# Patient Record
Sex: Female | Born: 2000
Health system: Southern US, Community
[De-identification: ages and names within clinical notes are randomized; demographics above are authoritative.]

## PROBLEM LIST (undated history)

## (undated) ENCOUNTER — Inpatient Hospital Stay (HOSPITAL_COMMUNITY): Payer: Self-pay

## (undated) DIAGNOSIS — O24410 Gestational diabetes mellitus in pregnancy, diet controlled: Secondary | ICD-10-CM

## (undated) DIAGNOSIS — O24419 Gestational diabetes mellitus in pregnancy, unspecified control: Secondary | ICD-10-CM

## (undated) DIAGNOSIS — Z8619 Personal history of other infectious and parasitic diseases: Secondary | ICD-10-CM

## (undated) DIAGNOSIS — E119 Type 2 diabetes mellitus without complications: Secondary | ICD-10-CM

## (undated) HISTORY — PX: NO PAST SURGERIES: SHX2092

## (undated) HISTORY — DX: Gestational diabetes mellitus in pregnancy, diet controlled: O24.410

---
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
## 2018-04-07 ENCOUNTER — Emergency Department (HOSPITAL_COMMUNITY)
Admission: EM | Admit: 2018-04-07 | Discharge: 2018-04-07 | Disposition: A | Discharge status: Home / Self Care | Payer: Self-pay | Attending: Emergency Medicine | Admitting: Emergency Medicine

## 2018-04-07 ENCOUNTER — Encounter (HOSPITAL_COMMUNITY): Payer: Self-pay | Admitting: Emergency Medicine

## 2018-04-07 DIAGNOSIS — N898 Other specified noninflammatory disorders of vagina: Secondary | ICD-10-CM | POA: Insufficient documentation

## 2018-04-07 DIAGNOSIS — L739 Follicular disorder, unspecified: Secondary | ICD-10-CM | POA: Insufficient documentation

## 2018-04-07 DIAGNOSIS — B9689 Other specified bacterial agents as the cause of diseases classified elsewhere: Secondary | ICD-10-CM | POA: Insufficient documentation

## 2018-04-07 DIAGNOSIS — N760 Acute vaginitis: Secondary | ICD-10-CM | POA: Insufficient documentation

## 2018-04-07 DIAGNOSIS — F1210 Cannabis abuse, uncomplicated: Secondary | ICD-10-CM | POA: Insufficient documentation

## 2018-04-07 DIAGNOSIS — B360 Pityriasis versicolor: Secondary | ICD-10-CM | POA: Insufficient documentation

## 2018-04-07 LAB — URINALYSIS, ROUTINE W REFLEX MICROSCOPIC
Bilirubin Urine: NEGATIVE
GLUCOSE, UA: NEGATIVE mg/dL
HGB URINE DIPSTICK: NEGATIVE
KETONES UR: NEGATIVE mg/dL
Leukocytes, UA: NEGATIVE
Nitrite: NEGATIVE
PROTEIN: NEGATIVE mg/dL
Specific Gravity, Urine: 1.015 (ref 1.005–1.030)
pH: 7 (ref 5.0–8.0)

## 2018-04-07 LAB — WET PREP, GENITAL
Sperm: NONE SEEN
Trich, Wet Prep: NONE SEEN
Yeast Wet Prep HPF POC: NONE SEEN

## 2018-04-07 LAB — POC URINE PREG, ED: Preg Test, Ur: NEGATIVE

## 2018-04-07 MED ORDER — SELENIUM SULFIDE 2.25 % EX SHAM: 1.0000 "application " | MEDICATED_SHAMPOO | Freq: Every day | CUTANEOUS | 0 refills | Status: AC

## 2018-04-07 MED ORDER — CLINDAMYCIN PHOSPHATE 1 % EX GEL: Freq: Two times a day (BID) | CUTANEOUS | 0 refills | Status: DC

## 2018-04-07 MED ORDER — METRONIDAZOLE 500 MG PO TABS: 500.0000 mg | ORAL_TABLET | Freq: Two times a day (BID) | ORAL | 0 refills | Status: DC

## 2018-04-07 MED ORDER — AZITHROMYCIN 250 MG PO TABS
1000.0000 mg | ORAL_TABLET | Freq: Once | ORAL | Status: DC
Start: 2018-04-07 — End: 2018-04-08
  Filled 2018-04-07: qty 4

## 2018-04-07 MED ORDER — CEFTRIAXONE SODIUM 250 MG IJ SOLR
250.0000 mg | Freq: Once | INTRAMUSCULAR | Status: DC
  Filled 2018-04-07: qty 250

## 2018-04-07 NOTE — ED Notes (Signed)
Pt refused medications upon DC, states "I dont believe I have an STD"

## 2018-04-07 NOTE — Discharge Instructions (Signed)
You were seen in the ER for rashes and vaginal discharge.  -Rash to your neck and back appears consistent with tinea versicolor, please see the attached handout regarding this diagnosis.  We are treating this with a topical shampoo, apply this daily for the next 7 days or until the rash goes away. -Rash to your pelvic area appears consistent with folliculitis, please see the attached handout, we are treating this with a topical antibiotic clindamycin, apply this twice per day for 7 days. -Bacterial vaginosis, this is a bacterial infection that is not sexually transmitted in the vaginal area, please see the attached handout, we are treating this with Flagyl, this is an antibiotic, take this twice daily.  You may not drink alcohol when taking Flagyl as it can cause severe side effects.  We also treated you prophylactically for gonorrhea and chlamydia in the emergency department.  If these results return positive we will call you and you will need to inform all sexual partners.  We have prescribed you new medication(s) today. Discuss the medications prescribed today with your pharmacist as they can have adverse effects and interactions with your other medicines including over the counter and prescribed medications. Seek medical evaluation if you start to experience new or abnormal symptoms after taking one of these medicines, seek care immediately if you start to experience difficulty breathing, feeling of your throat closing, facial swelling, or rash as these could be indications of a more serious allergic reaction  Follow-up with your primary care provider for reevaluation of all of your symptoms within 3 to 5 days.  Return to the ER for new or worsening symptoms or any other concerns.

## 2018-04-07 NOTE — ED Provider Notes (Signed)
[facility] EMERGENCY DEPARTMENT Provider Note   CSN: [id number] Arrival date & time: 04/07/18  1934     History   Chief Complaint Chief Complaint  Patient presents with  . Rash    HPI Joanne Cordova is a 17 y.o. female without significant past medical hx who presents to the ED for rash to groin which started 2 days ago. Patient states rash is to the pubic hair area, does not extend to vagina. She states rash is red and itchy. No specific alleviating/aggravating factors. She did wear a new bathing suit 2 days ago but the rash was present prior to this, otherwise no new products or exposures, she did not stay in the wet bathing suit for long periods of time and she was not in a hot tub. She has also had some clear to white vaginal discharge which is new for her. She is sexually active in a monogamous relationship, not concern for STDs. She also mentions rash to her neck and back, unsure when it started but it has been awhile, separate from pubic area rash, not itchy, not painful. Denies fever, chills, nausea, vomiting, abdominal pain, or dysuria.   HPI  Past Medical History:  Diagnosis Date  . Medical history non-contributory     There are no active problems to display for this patient.   Past Surgical History:  Procedure Laterality Date  . NO PAST SURGERIES       OB History    Gravida  1   Para      Term      Preterm      AB      Living        SAB      TAB      Ectopic      Multiple      Live Births               Home Medications    Prior to Admission medications   Not on File    Family History No family history on file.  Social History Social History   Tobacco Use  . Smoking status: Never Smoker  . Smokeless tobacco: Never Used  Substance Use Topics  . Alcohol use: Yes    Comment: socially  . Drug use: Yes    Types: Marijuana     Allergies   Patient has no known allergies.   Review of Systems Review of  Systems  Constitutional: Negative for chills and fever.  HENT:       Negative for feeling of throat closing.  Respiratory: Negative for shortness of breath.   Cardiovascular: Negative for chest pain.  Gastrointestinal: Negative for blood in stool, constipation, diarrhea, nausea and vomiting.  Genitourinary: Positive for vaginal discharge. Negative for dysuria, pelvic pain and vaginal bleeding.  Skin: Positive for rash.  All other systems reviewed and are negative.    Physical Exam Updated Vital Signs BP 120/71 (BP Location: Right Arm)   Pulse 97   Temp 98 F (36.7 C) (Oral)   Resp 16   Ht 5\' 6"  (1.676 m)   Wt 59 kg (130 lb)   LMP 03/23/2018   SpO2 97%   BMI 20.98 kg/m   Physical Exam  Constitutional: She appears well-developed and well-nourished. No distress.  HENT:  Head: Normocephalic and atraumatic.  Airway is patent.  Tolerating her secretions without difficulty.  No stridor.  Eyes: Conjunctivae are normal. Right eye exhibits no discharge. Left eye exhibits  no discharge.  Cardiovascular: Normal rate and regular rhythm.  No murmur heard. Pulmonary/Chest: Breath sounds normal. No respiratory distress. She has no wheezes. She has no rales.  Abdominal: Soft. She exhibits no distension. There is no tenderness.    Genitourinary: Pelvic exam was performed with patient supine. There is no rash or lesion on the right labia. There is no rash or lesion on the left labia. Cervix exhibits discharge (thin white). Cervix exhibits no motion tenderness. Right adnexum displays no mass, no tenderness and no fullness. Left adnexum displays no mass, no tenderness and no fullness. No erythema, tenderness or bleeding in the vagina. No foreign body in the vagina. Vaginal discharge (thin white) found.  Genitourinary Comments: RN present as chaperone.   Neurological: She is alert.  Clear speech.   Skin: Skin is warm and dry.  Patient has hypopigmented macular lesions to the neck and back of  varying sizes. No erythema. Not scaly. No vesicles. No induration. No warmth. No fluctuance.   Psychiatric: She has a normal mood and affect. Her behavior is normal.  Nursing note and vitals reviewed.    ED Treatments / Results  Labs Results for orders placed or performed during the hospital encounter of 04/07/18  Wet prep, genital  Result Value Ref Range   Yeast Wet Prep HPF POC NONE SEEN NONE SEEN   Trich, Wet Prep NONE SEEN NONE SEEN   Clue Cells Wet Prep HPF POC PRESENT (A) NONE SEEN   WBC, Wet Prep HPF POC MANY (A) NONE SEEN   Sperm NONE SEEN   Urinalysis, Routine w reflex microscopic  Result Value Ref Range   Color, Urine YELLOW YELLOW   APPearance HAZY (A) CLEAR   Specific Gravity, Urine 1.015 1.005 - 1.030   pH 7.0 5.0 - 8.0   Glucose, UA NEGATIVE NEGATIVE mg/dL   Hgb urine dipstick NEGATIVE NEGATIVE   Bilirubin Urine NEGATIVE NEGATIVE   Ketones, ur NEGATIVE NEGATIVE mg/dL   Protein, ur NEGATIVE NEGATIVE mg/dL   Nitrite NEGATIVE NEGATIVE   Leukocytes, UA NEGATIVE NEGATIVE  POC urine preg, ED  Result Value Ref Range   Preg Test, Ur NEGATIVE NEGATIVE   No results found. EKG None  Radiology No results found.  Procedures Procedures (including critical care time)  Medications Ordered in ED Medications  cefTRIAXone (ROCEPHIN) injection 250 mg (has no administration in time range)  azithromycin (ZITHROMAX) tablet 1,000 mg (has no administration in time range)     Initial Impression / Assessment and Plan / ED Course  I have reviewed the triage vital signs and the nursing notes.  Pertinent labs & imaging results that were available during my care of the patient were reviewed by me and considered in my medical decision making (see chart for details).   Patient presents with multiple complaints/concerns. Patient nontoxic appearing, in no apparent distress, vitals WNL.  - Regarding neck/back rash- appears consistent with tinea versicolor will treat with topical  selenium sulfide.  - Regarding pubic area rash- given distribution and appearance suspicious for folliculitis, no induration/fluctuance, not scaly to raise concern for fungal infection. No new products prior to onset of rash. Will treat with topical clindamycin.  - Regarding vaginal discharge- wet prep consistent with BV, will treat with Metronidazole, patient aware not to drink alcohol with this medicine. She is requesting GC/chlmaydia prophylaxis- azithromycin and ceftriaxone given in the ER. She declined testing for HIV/syphilis.  She is aware that she has cultures pending for gonorrhea and chlamydia and that she will  need to inform all sexual partners if positive.  UA not consistent with UTI.  Urine pregnancy test negative.  I discussed results, treatment plan, need for PCP follow-up, and return precautions with the patient. Provided opportunity for questions, patient confirmed understanding and is in agreement with plan.    Final Clinical Impressions(s) / ED Diagnoses   Final diagnoses:  Tinea versicolor  Folliculitis  Bacterial vaginosis    ED Discharge Orders        Ordered    metroNIDAZOLE (FLAGYL) 500 MG tablet  2 times daily     04/07/18 2159    Selenium Sulfide 2.25 % SHAM  Daily     04/07/18 2159    clindamycin (CLINDAGEL) 1 % gel  2 times daily     04/07/18 2159       Cherly Andersonetrucelli, Clara Smolen R, PA-C 04/08/18 0056    Benjiman CorePickering, Nathan, MD 04/08/18 1559

## 2018-04-07 NOTE — ED Triage Notes (Signed)
Pt c/o rash to groin, pt denies genital involvement but does report clear vaginal discharge.

## 2018-04-08 LAB — GC/CHLAMYDIA PROBE AMP (~~LOC~~) NOT AT ARMC
CHLAMYDIA, DNA PROBE: NEGATIVE
NEISSERIA GONORRHEA: NEGATIVE

## 2018-05-23 IMAGING — US US OB TRANSVAGINAL
1 series · 15 of 28 positions shown · non-contrast
Comparison: None

CLINICAL DATA: Vaginal bleeding in first trimester pregnancy

EXAM:
OBSTETRIC <14 WK US AND TRANSVAGINAL OB US
TECHNIQUE: Both transabdominal and transvaginal ultrasound examinations were
performed for complete evaluation of the gestation as well as the
maternal uterus, adnexal regions, and pelvic cul-de-sac.
Transvaginal technique was performed to assess early pregnancy.

[Series 1: us ob transvaginal · 95 acquisitions, 15 frames shown]
[im 1/95]
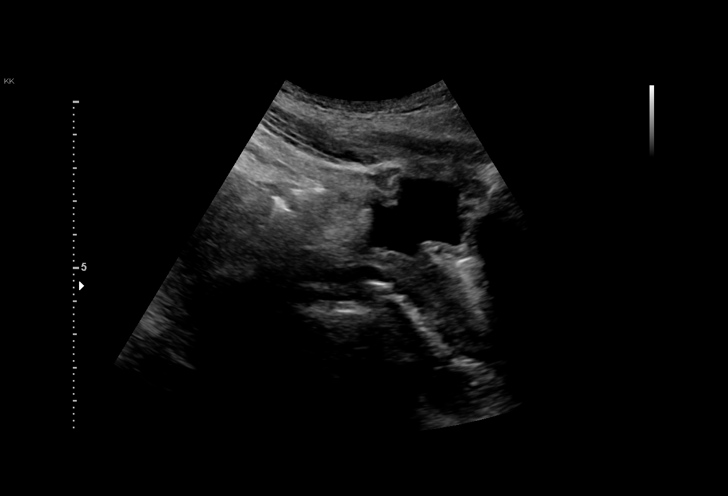
[im 7/95]
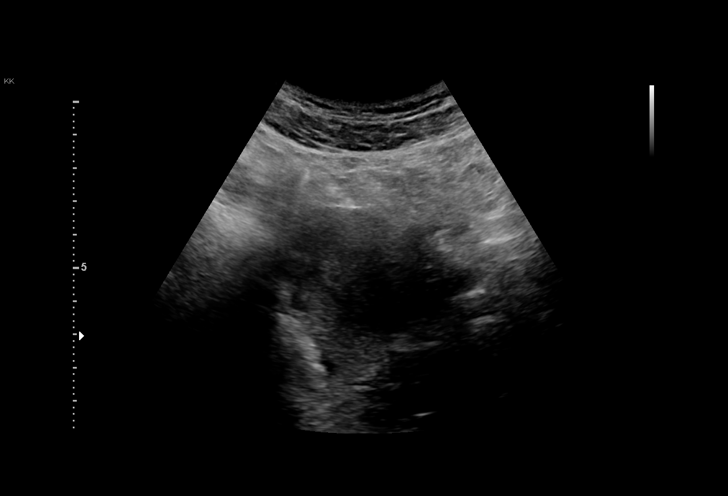
[im 14/95]
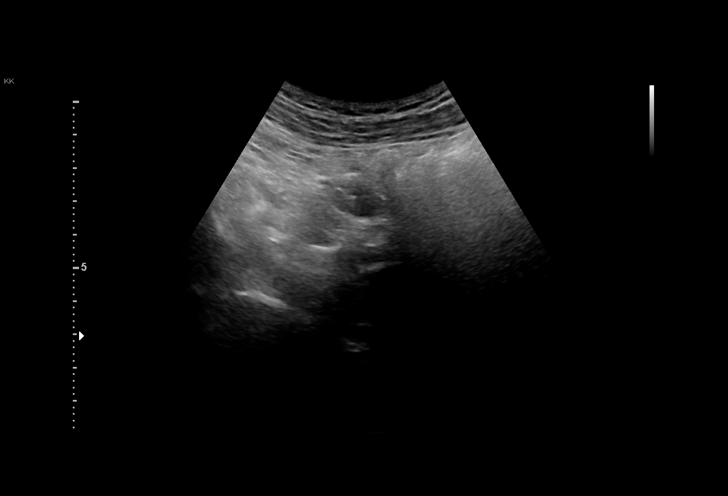
[im 21/95]
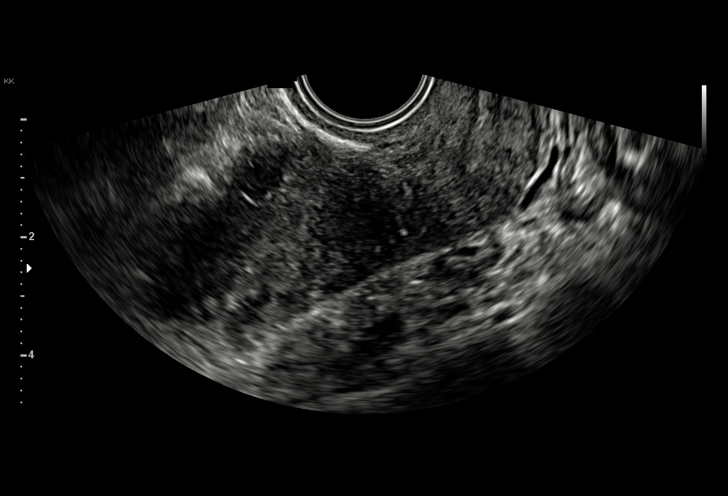
[im 28/95]
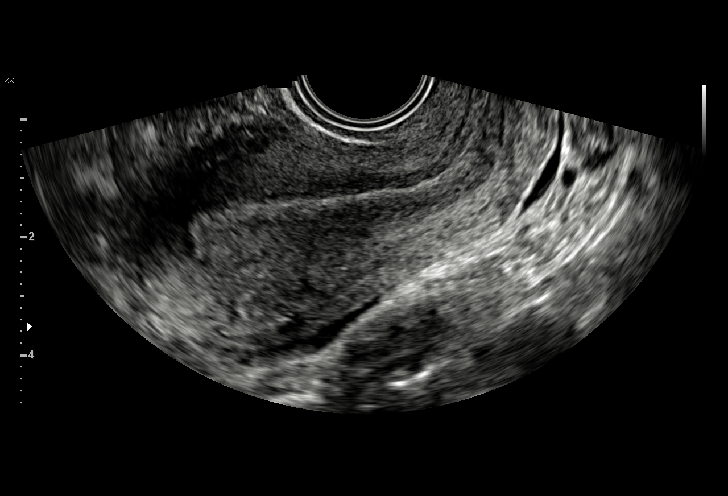
[im 35/95]
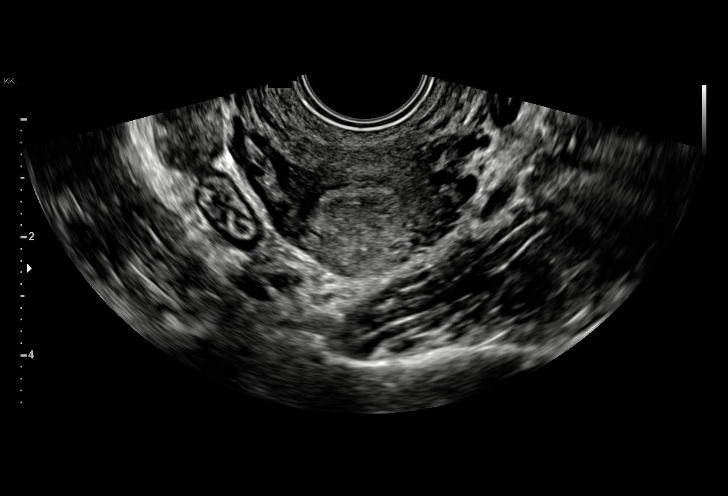
[im 42/95]
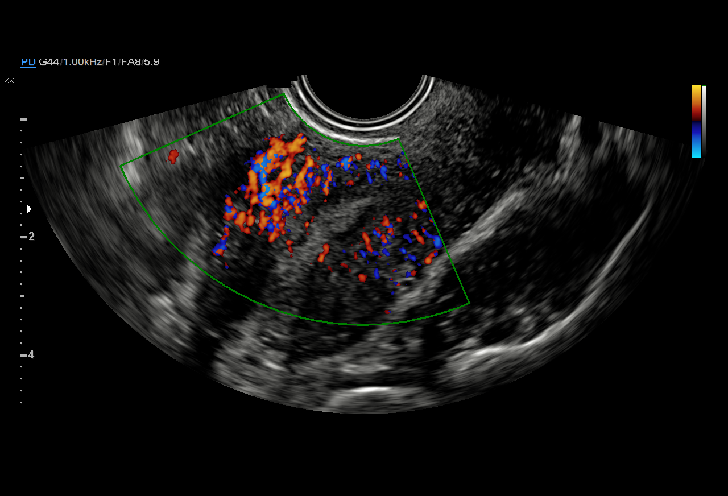
[im 49/95]
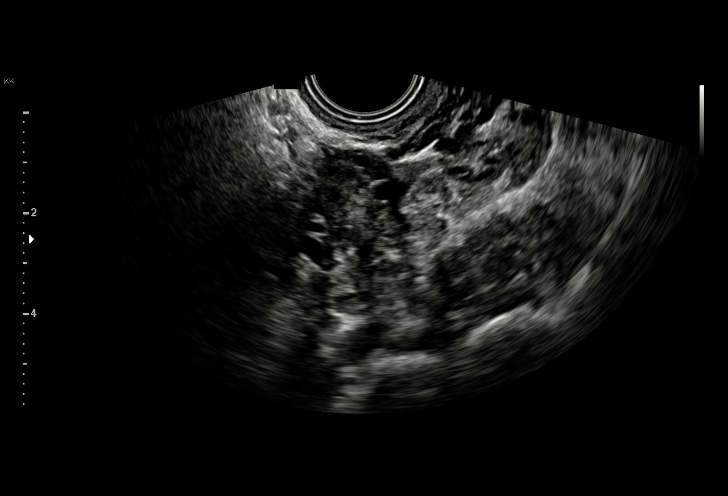
[im 53/95]
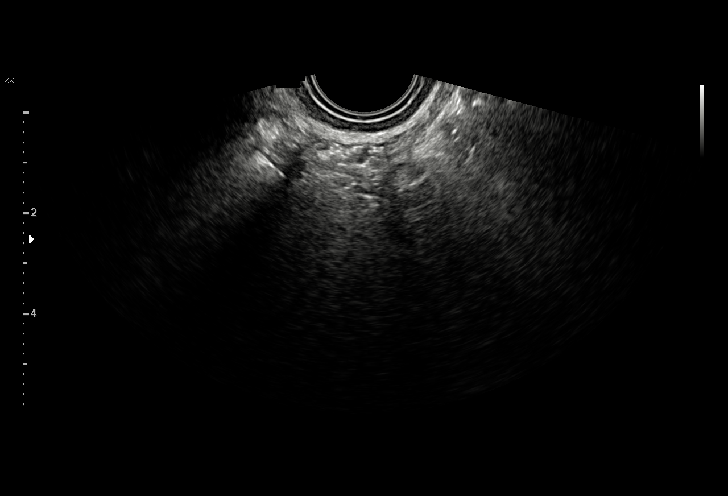
[im 60/95]
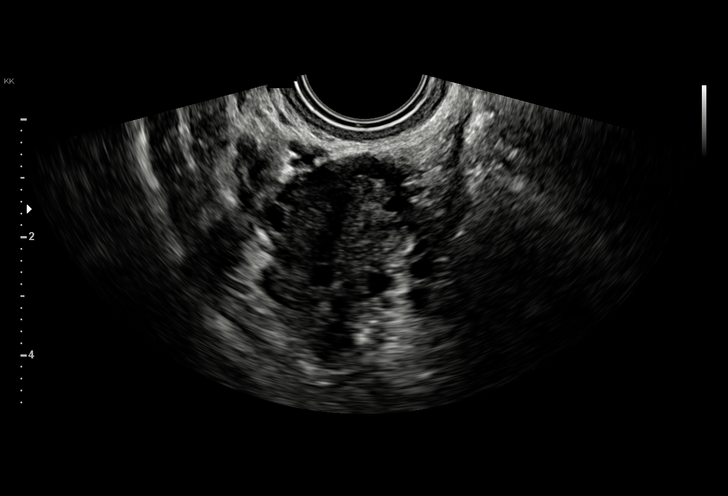
[im 67/95]
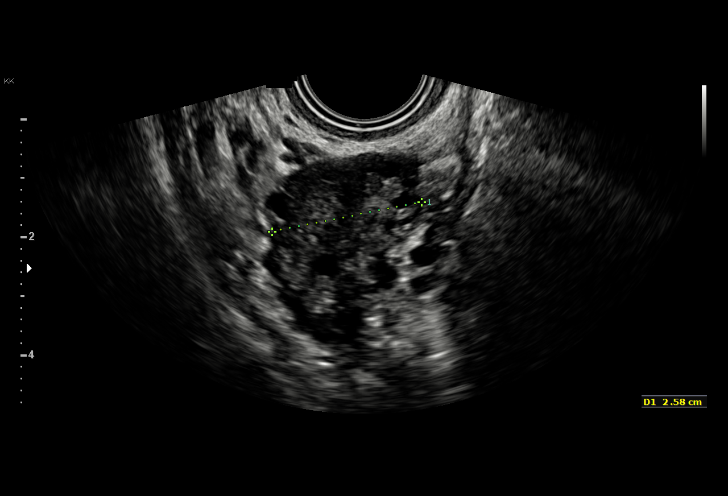
[im 74/95]
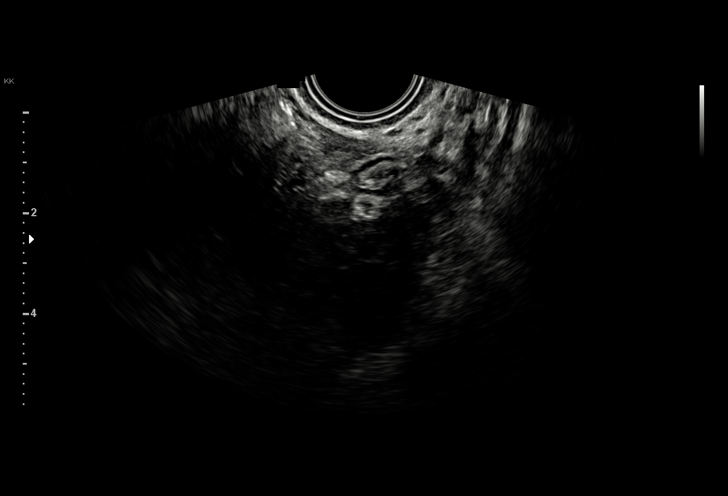
[im 81/95]
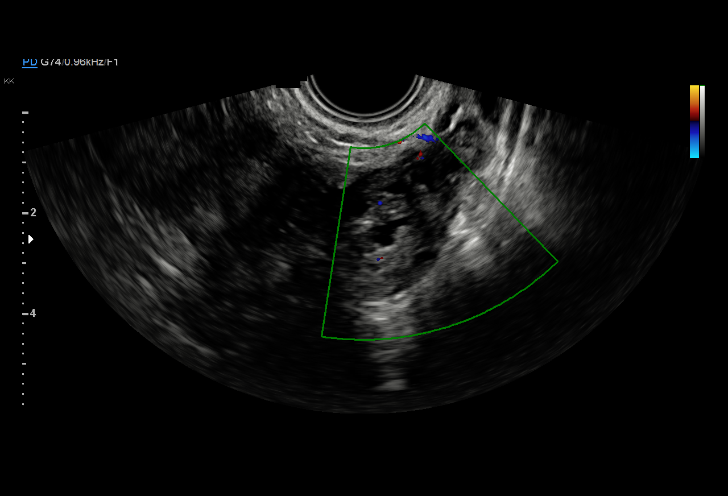
[im 88/95]
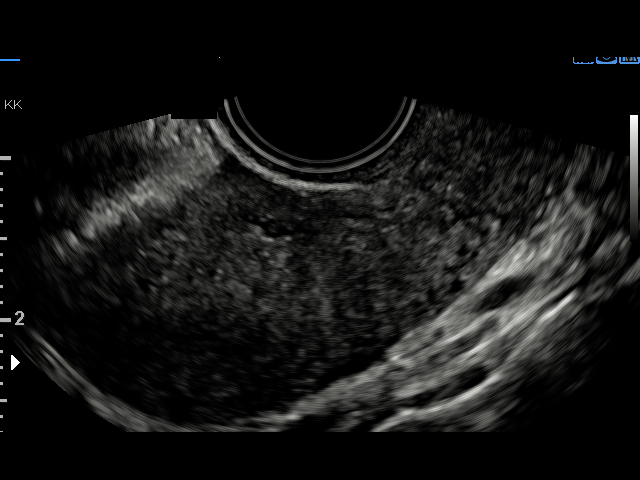
[im 95/95]
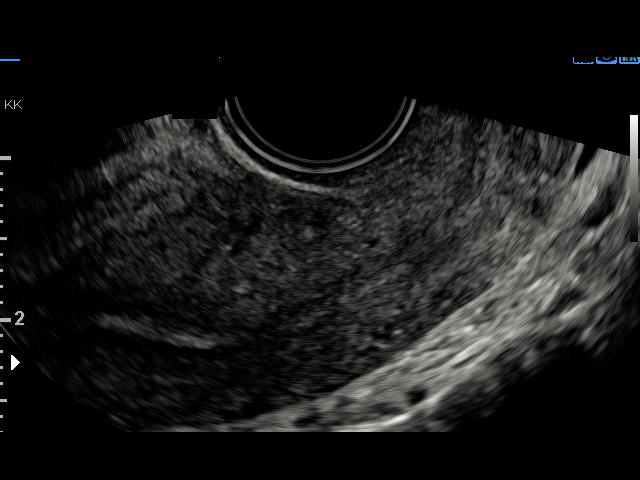

[15 of 28 positions shown; findings below may reference images not displayed]

FINDINGS: Intrauterine gestational sac: Not identified

Yolk sac:  N/A

Embryo:  N/A

Cardiac Activity: N/A

Heart Rate: N/A  bpm

MSD:   mm    w     d

CRL:    mm    w    d                  US EDC:

Subchorionic hemorrhage:  N/A

Maternal uterus/adnexae:

Focal hypoechogenicity seen at the lower uterine segment endometrial
canal, nonspecific.

Normal gestational sac not identified.

LEFT ovary normal size and morphology, 2.5 x 1.6 x 1.3 cm

RIGHT ovary normal size and morphology, 3.7 x 2.4 x 2.6 cm

No adnexal masses.

Small amount of nonspecific free pelvic fluid.
IMPRESSION: Question small amount of fluid and nonspecific material within the
endometrial canal of the lower uterine segment, could represent clot
or products of conception, polyp not completely excluded.

No intrauterine gestation identified.

Findings are compatible with pregnancy of unknown location.
Differential diagnosis includes early intrauterine pregnancy too
early to visualize, spontaneous abortion, and ectopic pregnancy.
Serial quantitative beta HCG and or followup ultrasound recommended
to definitively exclude ectopic pregnancy.

## 2018-05-25 ENCOUNTER — Other Ambulatory Visit: Payer: Self-pay

## 2018-05-25 ENCOUNTER — Emergency Department (HOSPITAL_COMMUNITY)
Admission: EM | Admit: 2018-05-25 | Discharge: 2018-05-25 | Disposition: A | Discharge status: Home / Self Care | Payer: Medicaid Other | Attending: Emergency Medicine | Admitting: Emergency Medicine

## 2018-05-25 DIAGNOSIS — Z3200 Encounter for pregnancy test, result unknown: Secondary | ICD-10-CM | POA: Diagnosis present

## 2018-05-25 DIAGNOSIS — Z3201 Encounter for pregnancy test, result positive: Secondary | ICD-10-CM | POA: Diagnosis not present

## 2018-05-25 DIAGNOSIS — Z79899 Other long term (current) drug therapy: Secondary | ICD-10-CM | POA: Insufficient documentation

## 2018-05-25 LAB — HCG, QUANTITATIVE, PREGNANCY: hCG, Beta Chain, Quant, S: 850 m[IU]/mL — ABNORMAL HIGH (ref <5)

## 2018-05-25 NOTE — ED Notes (Signed)
APP at bedside 

## 2018-05-25 NOTE — Discharge Instructions (Signed)
I have provided the number to the women's health clinic.  Please schedule an appointment and follow-up for a repeat beta quant.

## 2018-05-25 NOTE — ED Provider Notes (Signed)
[facility] Provider Note   CSN: [id number] Arrival date & time: 05/25/18  2121     History   Chief Complaint Chief Complaint  Patient presents with  . Possible Pregnancy    HPI Joanne Cordova is a 17 y.o. female.  17 y.o female G2P0 presents to the ED wanting a pregnancy confirmation test. She is 5 days late on her menstrual cycle.  Patient took about 3 pregnancy test at home and states the test are "fake", [facility].  Patient states she was pregnant in January but had a miscarriage.  Denies any complaints and states she is here solely for confirmation.  Denies any abdominal pain, vaginal bleeding, vaginal discharge.     Past Medical History:  Diagnosis Date  . Medical history non-contributory     There are no active problems to display for this patient.   Past Surgical History:  Procedure Laterality Date  . NO PAST SURGERIES       OB History    Gravida  1   Para      Term      Preterm      AB      Living        SAB      TAB      Ectopic      Multiple      Live Births               Home Medications    Prior to Admission medications   Medication Sig Start Date End Date Taking? Authorizing Provider  clindamycin (CLINDAGEL) 1 % gel Apply topically 2 (two) times daily. 04/07/18   Petrucelli, Samantha R, PA-C  metroNIDAZOLE (FLAGYL) 500 MG tablet Take 1 tablet (500 mg total) by mouth 2 (two) times daily. 04/07/18   Petrucelli, Pleas KochSamantha R, PA-C    Family History No family history on file.  Social History Social History   Tobacco Use  . Smoking status: Never Smoker  . Smokeless tobacco: Never Used  Substance Use Topics  . Alcohol use: Yes    Comment: socially  . Drug use: Yes    Types: Marijuana     Allergies   Patient has no known allergies.   Review of Systems Review of Systems  Constitutional: Negative for chills and fever.  HENT: Negative for ear  pain and sore throat.   Eyes: Negative for pain and visual disturbance.  Respiratory: Negative for cough and shortness of breath.   Cardiovascular: Negative for chest pain and palpitations.  Gastrointestinal: Negative for abdominal pain and vomiting.  Genitourinary: Negative for dysuria and hematuria.  Musculoskeletal: Negative for arthralgias and back pain.  Skin: Negative for color change and rash.  Neurological: Negative for seizures and syncope.  All other systems reviewed and are negative.    Physical Exam Updated Vital Signs BP (!) 120/56 (BP Location: Right Arm)   Pulse 89   Temp 98.2 F (36.8 C) (Oral)   Ht 5\' 6"  (1.676 m)   Wt 56.7 kg   SpO2 100%   BMI 20.18 kg/m   Physical Exam  Constitutional: She is oriented to person, place, and time. She appears well-developed and well-nourished.  HENT:  Head: Normocephalic and atraumatic.  Neck: Normal range of motion. Neck supple.  Pulmonary/Chest: Effort normal.  Abdominal: Soft.  Neurological: She is alert and oriented to person, place, and time.  Skin: Skin is warm and dry.  Psychiatric: Her behavior  is normal.  Nursing note and vitals reviewed.    ED Treatments / Results  Labs (all labs ordered are listed, but only abnormal results are displayed) Labs Reviewed  HCG, QUANTITATIVE, PREGNANCY - Abnormal; Notable for the following components:      Result Value   hCG, Beta Chain, Quant, S 850 (*)    All other components within normal limits    EKG None  Radiology No results found.  Procedures Procedures (including critical care time)  Medications Ordered in ED Medications - No data to display   Initial Impression / Assessment and Plan / ED Course  I have reviewed the triage vital signs and the nursing notes.  Pertinent labs & imaging results that were available during my care of the patient were reviewed by me and considered in my medical decision making (see chart for details).     She presents  requesting confirmation for pregnancy.  She denies any complaints and states she would like a blood test done. Beta hCG was ordered.  Data hCG resulted 850.  This time I will provide a referral to the women's clinic for a beta-hCG recheck.  Patient states she does not have a primary care therefore I will provide [facility].  Patient stands and agrees with the plan.  Return precautions provided. Final Clinical Impressions(s) / ED Diagnoses   Final diagnoses:  Positive pregnancy test    ED Discharge Orders    None       Claude MangesSoto, Rumaisa Schnetzer, PA-C 05/25/18 2327    Margarita Grizzleay, Danielle, MD 05/26/18 1745

## 2018-05-25 NOTE — ED Triage Notes (Signed)
Per pt she stated she wanted to make sure she was pregnant. Last period was July 16th. Pt stated she has taken three home pregnancy test and all three positive. wanting blood drawn.

## 2018-05-28 ENCOUNTER — Ambulatory Visit (INDEPENDENT_AMBULATORY_CARE_PROVIDER_SITE_OTHER): Payer: Self-pay | Admitting: *Deleted

## 2018-05-28 DIAGNOSIS — Z3490 Encounter for supervision of normal pregnancy, unspecified, unspecified trimester: Secondary | ICD-10-CM

## 2018-05-28 NOTE — Progress Notes (Signed)
Pt presents for BHCG. She had gone to ED on 8/17 for confirmation of pregnancy only and had BHCG 850. She was not having any pain or bleeding on 8/17 and again reports no abdominal/pelvic pain or vaginal bleeding. Pt states she feels good and endorses presence of breast tenderness. Pt plans prenatal care @ [facility] provided test result is indicative of normal pregnancy progression. She will be notified of test results via MyChart.

## 2018-05-29 LAB — BETA HCG QUANT (REF LAB): HCG QUANT: 2311 m[IU]/mL

## 2018-05-30 NOTE — Progress Notes (Signed)
I have reviewed this chart and agree with the RN/CMA assessment and management.    K. Meryl Davis, M.D. [facility]  

## 2018-05-31 ENCOUNTER — Telehealth: Payer: Self-pay | Admitting: Family Medicine

## 2018-05-31 NOTE — Telephone Encounter (Signed)
Patient is calling for results. Requesting a call back today.

## 2018-06-03 ENCOUNTER — Telehealth: Payer: Self-pay | Admitting: Obstetrics & Gynecology

## 2018-06-03 NOTE — Telephone Encounter (Signed)
Patient stated this was her third time calling, and all she wanted was to get her results. I apologized, and looked them up for her. I told her I was not clinical, but I would see if I could get a nurse to look at them with me. Joni Reiningicole CMA stated they were rising. I informed the patient. She thanked me, and hung up the phone.

## 2018-06-09 ENCOUNTER — Other Ambulatory Visit: Payer: Self-pay

## 2018-06-09 ENCOUNTER — Inpatient Hospital Stay (HOSPITAL_COMMUNITY): Payer: Medicaid Other

## 2018-06-09 ENCOUNTER — Encounter (HOSPITAL_COMMUNITY): Payer: Self-pay

## 2018-06-09 ENCOUNTER — Inpatient Hospital Stay (HOSPITAL_COMMUNITY)
Admission: EM | Admit: 2018-06-09 | Discharge: 2018-06-09 | Disposition: A | Discharge status: Home / Self Care | Payer: Medicaid Other | Source: Ambulatory Visit | Attending: Obstetrics & Gynecology | Admitting: Obstetrics & Gynecology

## 2018-06-09 DIAGNOSIS — O418X10 Other specified disorders of amniotic fluid and membranes, first trimester, not applicable or unspecified: Secondary | ICD-10-CM

## 2018-06-09 DIAGNOSIS — O468X1 Other antepartum hemorrhage, first trimester: Secondary | ICD-10-CM

## 2018-06-09 DIAGNOSIS — O209 Hemorrhage in early pregnancy, unspecified: Secondary | ICD-10-CM | POA: Diagnosis present

## 2018-06-09 DIAGNOSIS — Z3A01 Less than 8 weeks gestation of pregnancy: Secondary | ICD-10-CM | POA: Diagnosis not present

## 2018-06-09 DIAGNOSIS — O208 Other hemorrhage in early pregnancy: Secondary | ICD-10-CM | POA: Diagnosis not present

## 2018-06-09 DIAGNOSIS — Z3480 Encounter for supervision of other normal pregnancy, unspecified trimester: Secondary | ICD-10-CM

## 2018-06-09 LAB — URINALYSIS, ROUTINE W REFLEX MICROSCOPIC
Bilirubin Urine: NEGATIVE
GLUCOSE, UA: NEGATIVE mg/dL
KETONES UR: NEGATIVE mg/dL
LEUKOCYTES UA: NEGATIVE
Nitrite: NEGATIVE
PH: 7 (ref 5.0–8.0)
Protein, ur: NEGATIVE mg/dL
Specific Gravity, Urine: 1.019 (ref 1.005–1.030)

## 2018-06-09 LAB — CBC
HCT: 38.9 % (ref 36.0–46.0)
HEMOGLOBIN: 13.1 g/dL (ref 12.0–15.0)
MCH: 29.6 pg (ref 26.0–34.0)
MCHC: 33.7 g/dL (ref 30.0–36.0)
MCV: 87.8 fL (ref 78.0–100.0)
Platelets: 223 10*3/uL (ref 150–400)
RBC: 4.43 MIL/uL (ref 3.87–5.11)
RDW: 12.3 % (ref 11.5–15.5)
WBC: 6.4 10*3/uL (ref 4.0–10.5)

## 2018-06-09 LAB — WET PREP, GENITAL
Clue Cells Wet Prep HPF POC: NONE SEEN
SPERM: NONE SEEN
TRICH WET PREP: NONE SEEN
YEAST WET PREP: NONE SEEN

## 2018-06-09 LAB — POCT PREGNANCY, URINE: Preg Test, Ur: POSITIVE — AB

## 2018-06-09 LAB — HCG, QUANTITATIVE, PREGNANCY: HCG, BETA CHAIN, QUANT, S: 51843 m[IU]/mL — AB (ref <5)

## 2018-06-09 MED ORDER — CONCEPT OB 130-92.4-1 MG PO CAPS: 1.0000 | ORAL_CAPSULE | Freq: Every day | ORAL | 12 refills | Status: DC

## 2018-06-09 NOTE — Discharge Instructions (Signed)
[facility] [employer] [facility] [employer] at [facility]       Phone: [phone]  [facility] [employer] at [employer] Phone: [phone]  [facility] [employer] at [place]  Phone: [phone]  [facility] [employer] at [employer]  Phone: [phone]  Center for [facility] at [place]  Phone: [phone]  [facility]       Phone: [phone]  [facility]    Phone: [phone]   [facility])    Phone: [phone]  [facility]    Phone: [phone]  [facility]    Phone: [phone]  [facility]    Phone: [phone]  [facility]       Phone: [phone]   [facility]-M[facility]  Phone: [phone]  [facility]    Phone: [phone]  [facility]   Phone: [phone]  [facility]      Phone: [phone]  [facility]    Phone: [phone]    How a Baby Grows During Pregnancy Pregnancy begins when a female's sperm enters a female's egg (fertilization). This happens in one of the tubes (fallopian tubes) that connect the ovaries to the womb (uterus). The fertilized egg is called an embryo until it reaches 10 weeks. From 10 weeks until birth, it is called a fetus. The fertilized egg moves down the fallopian tube to the uterus. Then it implants into the lining of the uterus and begins to grow. The developing fetus receives oxygen and nutrients through the pregnant woman's bloodstream and the tissues that grow (placenta) to support the fetus. The placenta is the life support system for the fetus. It provides nutrition and removes waste. Learning as much as you can about your pregnancy and how your baby is developing can help you enjoy the experience.  It can also make you aware of when there might be a problem and when to ask questions. How long does a typical pregnancy last? A pregnancy usually lasts 280 days, or about 40 weeks. Pregnancy is divided into three trimesters:  First trimester: 0-13 weeks.  Second trimester: 14-27 weeks.  Third trimester: 28-40 weeks.  The day when your baby is considered ready to be born (full term) is your estimated date of delivery. How does my baby develop month by month? First month  The fertilized egg attaches to the inside of the uterus.  Some cells will form the placenta. Others will form the fetus.  The arms, legs, brain, spinal cord, lungs, and heart begin to develop.  At the end of the first month, the heart begins to beat.  Second month  The bones, inner ear, eyelids, hands, and feet form.  The genitals develop.  By the end of 8 weeks, all major organs are developing.  Third month  All of the internal organs are forming.  Teeth develop below the gums.  Bones and muscles begin to grow. The spine can flex.  The skin is transparent.  Fingernails and toenails begin to form.  Arms and legs continue to grow longer, and hands and feet develop.  The fetus is about 3 in (7.6 cm) long.  Fourth month  The placenta is completely formed.  The external sex organs, neck, outer ear, eyebrows, eyelids, and fingernails are formed.  The fetus can hear, swallow, and move its arms and legs.  The kidneys begin to  produce urine.  The skin is covered with a white waxy coating (vernix) and very fine hair (lanugo).  Fifth month  The fetus moves around more and can be felt for the first time (quickening).  The fetus starts to sleep and wake up and may begin to suck its finger.  The nails grow to the end of the fingers.  The organ in the digestive system that makes bile (gallbladder) functions and helps to digest the nutrients.  If your baby is a girl, eggs are present in her  ovaries. If your baby is a boy, testicles start to move down into his scrotum.  Sixth month  The lungs are formed, but the fetus is not yet able to breathe.  The eyes open. The brain continues to develop.  Your baby has fingerprints and toe prints. Your baby's hair grows thicker.  At the end of the second trimester, the fetus is about 9 in (22.9 cm) long.  Seventh month  The fetus kicks and stretches.  The eyes are developed enough to sense changes in light.  The hands can make a grasping motion.  The fetus responds to sound.  Eighth month  All organs and body systems are fully developed and functioning.  Bones harden and taste buds develop. The fetus may hiccup.  Certain areas of the brain are still developing. The skull remains soft.  Ninth month  The fetus gains about  lb (0.23 kg) each week.  The lungs are fully developed.  Patterns of sleep develop.  The fetus's head typically moves into a head-down position (vertex) in the uterus to prepare for birth. If the buttocks move into a vertex position instead, the baby is breech.  The fetus weighs 6-9 lbs (2.72-4.08 kg) and is 19-20 in (48.26-50.8 cm) long.  What can I do to have a healthy pregnancy and help my baby develop? Eating and Drinking  Eat a healthy diet. ? Talk with your health care provider to make sure that you are getting the nutrients that you and your baby need. ? Visit www.DisposableNylon.be to learn about creating a healthy diet.  Gain a healthy amount of weight during pregnancy as advised by your health care provider. This is usually 25-35 pounds. You may need to: ? Gain more if you were underweight before getting pregnant or if you are pregnant with more than one baby. ? Gain less if you were overweight or obese when you got pregnant.  Medicines and Vitamins  Take prenatal vitamins as directed by your health care provider. These include vitamins such as folic acid, iron, calcium, and vitamin  D. They are important for healthy development.  Take medicines only as directed by your health care provider. Read labels and ask a pharmacist or your health care provider whether over-the-counter medicines, supplements, and prescription drugs are safe to take during pregnancy.  Activities  Be physically active as advised by your health care provider. Ask your health care provider to recommend activities that are safe for you to do, such as walking or swimming.  Do not participate in strenuous or extreme sports.  Lifestyle  Do not drink alcohol.  Do not use any tobacco products, including cigarettes, chewing tobacco, or electronic cigarettes. If you need help quitting, ask your health care provider.  Do not use illegal drugs.  Safety  Avoid exposure to mercury, lead, or other heavy metals. Ask your health care provider about common sources of these heavy metals.  Avoid listeria infection during pregnancy. Follow these  precautions: ? Do not eat soft cheeses or deli meats. ? Do not eat hot dogs unless they have been warmed up to the point of steaming, such as in the microwave oven. ? Do not drink unpasteurized milk.  Avoid toxoplasmosis infection during pregnancy. Follow these precautions: ? Do not change your cat's litter box, if you have a cat. Ask someone else to do this for you. ? Wear gardening gloves while working in the yard.  General Instructions  Keep all follow-up visits as directed by your health care provider. This is important. This includes prenatal care and screening tests.  Manage any chronic health conditions. Work closely with your health care provider to keep conditions, such as diabetes, under control.  How do I know if my baby is developing well? At each prenatal visit, your health care provider will do several different tests to check on your health and keep track of your babys development. These include:  Fundal height. ? Your health care provider will  measure your growing belly from top to bottom using a tape measure. ? Your health care provider will also feel your belly to determine your baby's position.  Heartbeat. ? An ultrasound in the first trimester can confirm pregnancy and show a heartbeat, depending on how far along you are. ? Your health care provider will check your baby's heart rate at every prenatal visit. ? As you get closer to your delivery date, you may have regular fetal heart rate monitoring to make sure that your baby is not in distress.  Second trimester ultrasound. ? This ultrasound checks your baby's development. It also indicates your babys gender.  What should I do if I have concerns about my baby's development? Always talk with your health care provider about any concerns that you may have. This information is not intended to replace advice given to you by your health care provider. Make sure you discuss any questions you have with your health care provider. Document Released: 03/13/2008 Document Revised: 03/02/2016 Document Reviewed: 03/04/2014 Elsevier Interactive Patient Education  2018 [employer] Inc.   Subchorionic Hematoma A subchorionic hematoma is a gathering of blood between the outer wall of the placenta and the inner wall of the womb (uterus). The placenta is the organ that connects the fetus to the wall of the uterus. The placenta performs the feeding, breathing (oxygen to the fetus), and waste removal (excretory work) of the fetus. Subchorionic hematoma is the most common abnormality found on a result from ultrasonography done during the first trimester or early second trimester of pregnancy. If there has been little or no vaginal bleeding, early small hematomas usually shrink on their own and do not affect your baby or pregnancy. The blood is gradually absorbed over 1-2 weeks. When bleeding starts later in pregnancy or the hematoma is larger or occurs in an older pregnant woman, the outcome may not be as  good. Larger hematomas may get bigger, which increases the chances for miscarriage. Subchorionic hematoma also increases the risk of premature detachment of the placenta from the uterus, preterm (premature) labor, and stillbirth. Follow these instructions at home:  Stay on bed rest if your health care provider recommends this. Although bed rest will not prevent more bleeding or prevent a miscarriage, your health care provider may recommend bed rest until you are advised otherwise.  Avoid heavy lifting (more than 10 lb [4.5 kg]), exercise, sexual intercourse, or douching as directed by your health care provider.  Keep track of the number of pads  you use each day and how soaked (saturated) they are. Write down this information.  Do not use tampons.  Keep all follow-up appointments as directed by your health care provider. Your health care provider may ask you to have follow-up blood tests or ultrasound tests or both. Get help right away if:  You have severe cramps in your stomach, back, abdomen, or pelvis.  You have a fever.  You pass large clots or tissue. Save any tissue for your health care provider to look at.  Your bleeding increases or you become lightheaded, feel weak, or have fainting episodes. This information is not intended to replace advice given to you by your health care provider. Make sure you discuss any questions you have with your health care provider. Document Released: 01/10/2007 Document Revised: 03/02/2016 Document Reviewed: 04/24/2013 Elsevier Interactive Patient Education  2017 [employer].

## 2018-06-09 NOTE — MAU Note (Signed)
+   HPT  Last night and this AM had some light pink and brown spotting, no pain, no discharge

## 2018-06-09 NOTE — MAU Provider Note (Signed)
Chief Complaint: Vaginal Bleeding   First Provider Initiated Contact with Patient 06/09/18 1208     SUBJECTIVE HPI: Joanne Cordova is a 17 y.o. G3P0010 at [redacted]w[redacted]d who presents to Maternity Admissions reporting:  Vaginal Bleeding: Small amount of vaginal bleeding since last night Passage of tissue or clots: Passed a tiny clot.  Unsure if she passed any tissue. Dizziness: Denies.  No recent intercourse.  Positive UPT and ED and normal rise in quantitative hCG from 05/25/2018-05/28/2018.  Ultrasound not indicated at that time.  Has not had any ultrasounds this pregnancy.  O POS  Pain Location: None   Past Medical History:  Diagnosis Date  . Medical history non-contributory    OB History  Gravida Para Term Preterm AB Living  3       1    SAB TAB Ectopic Multiple Live Births  1            # Outcome Date GA Lbr Len/2nd Weight Sex Delivery Anes PTL Lv  3 Current           2 SAB 2018          1 Gravida            Past Surgical History:  Procedure Laterality Date  . NO PAST SURGERIES     Social History   Socioeconomic History  . Marital status: Single    Spouse name: Not on file  . Number of children: Not on file  . Years of education: Not on file  . Highest education level: Not on file  Occupational History  . Not on file  Social Needs  . Financial resource strain: Not on file  . Food insecurity:    Worry: Not on file    Inability: Not on file  . Transportation needs:    Medical: Not on file    Non-medical: Not on file  Tobacco Use  . Smoking status: Never Smoker  . Smokeless tobacco: Never Used  Substance and Sexual Activity  . Alcohol use: Yes    Comment: socially  . Drug use: Yes    Types: Marijuana  . Sexual activity: Yes    Birth control/protection: None  Lifestyle  . Physical activity:    Days per week: Not on file    Minutes per session: Not on file  . Stress: Not on file  Relationships  . Social connections:    Talks on phone: Not on file    Gets  together: Not on file    Attends religious service: Not on file    Active member of club or organization: Not on file    Attends meetings of clubs or organizations: Not on file    Relationship status: Not on file  . Intimate partner violence:    Fear of current or ex partner: Not on file    Emotionally abused: Not on file    Physically abused: Not on file    Forced sexual activity: Not on file  Other Topics Concern  . Not on file  Social History Narrative  . Not on file   No current facility-administered medications on file prior to encounter.    Current Outpatient Medications on File Prior to Encounter  Medication Sig Dispense Refill  . metroNIDAZOLE (FLAGYL) 500 MG tablet Take 1 tablet (500 mg total) by mouth 2 (two) times daily. 14 tablet 0  . clindamycin (CLINDAGEL) 1 % gel Apply topically 2 (two) times daily. 30 g 0   No Known Allergies  I have reviewed the past Medical Hx, Surgical Hx, Social Hx, Allergies and Medications.   Review of Systems  Constitutional: Negative for chills and fever.  Gastrointestinal: Negative for abdominal pain.  Genitourinary: Positive for vaginal bleeding. Negative for hematuria and vaginal discharge.  Neurological: Negative for dizziness.    OBJECTIVE Patient Vitals for the past 24 hrs:  BP Temp Temp src Pulse Resp Weight  06/09/18 1503 102/62 - - 92 18 -  06/09/18 1131 (!) 117/97 98 F (36.7 C) Oral 77 18 61.2 kg   Constitutional: Well-developed, well-nourished female in no acute distress.  Cardiovascular: normal rate Respiratory: normal rate and effort.  GI: Abd soft, non-tender. MS: Extremities nontender, no edema, normal ROM Neurologic: Alert and oriented x 4.  GU:  SPECULUM EXAM: Refused  LAB RESULTS Results for orders placed or performed during the hospital encounter of 06/09/18 (from the past 24 hour(s))  Urinalysis, Routine w reflex microscopic     Status: Abnormal   Collection Time: 06/09/18 11:34 AM  Result Value Ref  Range   Color, Urine YELLOW YELLOW   APPearance CLOUDY (A) CLEAR   Specific Gravity, Urine 1.019 1.005 - 1.030   pH 7.0 5.0 - 8.0   Glucose, UA NEGATIVE NEGATIVE mg/dL   Hgb urine dipstick MODERATE (A) NEGATIVE   Bilirubin Urine NEGATIVE NEGATIVE   Ketones, ur NEGATIVE NEGATIVE mg/dL   Protein, ur NEGATIVE NEGATIVE mg/dL   Nitrite NEGATIVE NEGATIVE   Leukocytes, UA NEGATIVE NEGATIVE   RBC / HPF 0-5 0 - 5 RBC/hpf   WBC, UA 0-5 0 - 5 WBC/hpf   Bacteria, UA RARE (A) NONE SEEN   Squamous Epithelial / LPF 0-5 0 - 5   Mucus PRESENT   Pregnancy, urine POC     Status: Abnormal   Collection Time: 06/09/18 11:36 AM  Result Value Ref Range   Preg Test, Ur POSITIVE (A) NEGATIVE  hCG, quantitative, pregnancy     Status: Abnormal   Collection Time: 06/09/18 12:36 PM  Result Value Ref Range   hCG, Beta Chain, Quant, S 51,843 (H) <5 mIU/mL  CBC     Status: None   Collection Time: 06/09/18 12:36 PM  Result Value Ref Range   WBC 6.4 4.0 - 10.5 K/uL   RBC 4.43 3.87 - 5.11 MIL/uL   Hemoglobin 13.1 12.0 - 15.0 g/dL   HCT 16.1 09.6 - 04.5 %   MCV 87.8 78.0 - 100.0 fL   MCH 29.6 26.0 - 34.0 pg   MCHC 33.7 30.0 - 36.0 g/dL   RDW 40.9 81.1 - 91.4 %   Platelets 223 150 - 400 K/uL    IMAGING US Ob Comp Less 14 Wks  Result Date: 06/09/2018 CLINICAL DATA:  Six weeks pregnant, quantitative beta HCG 51,843 EXAM: OBSTETRIC <14 WK [place] AND TRANSVAGINAL OB US TECHNIQUE: Both transabdominal and transvaginal ultrasound examinations were performed for complete evaluation of the gestation as well as the maternal uterus, adnexal regions, and pelvic cul-de-sac. Transvaginal technique was performed to assess early pregnancy. COMPARISON:  None. FINDINGS: Intrauterine gestational sac: Single Yolk sac:  Visualized. Embryo:  Visualized. Cardiac Activity: Visualized. Heart Rate: 119 bpm CRL:  7.2 mm   6 w   4 d                  [place] EDC: 01/29/2019 Subchorionic hemorrhage: Small subchronic hemorrhage noted superiorly in the  fundus region. Maternal uterus/adnexae: Single viable 6 week 4 day IUP. Right ovary contains a corpus luteum cyst. No  free fluid. IMPRESSION: Single viable 6 week 4 day IUP. Small subchorionic hemorrhage superiorly. Electronically Signed   By: Judie Petit.  Shick M.D.   On: 06/09/2018 14:28   US Ob Transvaginal  Result Date: 06/09/2018 CLINICAL DATA:  Six weeks pregnant, quantitative beta HCG 51,843 EXAM: OBSTETRIC <14 WK [place] AND TRANSVAGINAL OB US TECHNIQUE: Both transabdominal and transvaginal ultrasound examinations were performed for complete evaluation of the gestation as well as the maternal uterus, adnexal regions, and pelvic cul-de-sac. Transvaginal technique was performed to assess early pregnancy. COMPARISON:  None. FINDINGS: Intrauterine gestational sac: Single Yolk sac:  Visualized. Embryo:  Visualized. Cardiac Activity: Visualized. Heart Rate: 119 bpm CRL:  7.2 mm   6 w   4 d                  [place] EDC: 01/29/2019 Subchorionic hemorrhage: Small subchronic hemorrhage noted superiorly in the fundus region. Maternal uterus/adnexae: Single viable 6 week 4 day IUP. Right ovary contains a corpus luteum cyst. No free fluid. IMPRESSION: Single viable 6 week 4 day IUP. Small subchorionic hemorrhage superiorly. Electronically Signed   By: Judie Petit.  Shick M.D.   On: 06/09/2018 14:28    MAU COURSE CBC, Quant, ABO/Rh, ultrasound, wet prep and GC/chlamydia culture, UA.  Patient refused pelvic exam.  CNM explained to her that my ability to accurately diagnose the cause of her bleeding is limited by not being able to do the exam.  Patient verbalized understanding.  Still refuses.  MDM Pain and bleeding in early pregnancy with normal intrauterine pregnancy and hemodynamically stable. Likely from [facility].   ASSESSMENT 1. Intrauterine pregnancy in teenager   2. Vaginal bleeding affecting early pregnancy   3. Subchorionic hemorrhage of placenta in first trimester, single or unspecified fetus     PLAN Discharge home in stable  condition. Bleeding and first trimester precautions Rx prenatal vitamin. List of providers given. Pelvic rest x1 week. Follow-up Information    Obstetrician of your choice Follow up.   Why:  Start prenatal care       THE [facility] [place] MATERNITY ADMISSIONS Follow up.   Why:  in pregnancy emergencies Contact information: [address] [id number] mc [place] [address] [phone]         Allergies as of 06/09/2018   No Known Allergies     Medication List    STOP taking these medications   clindamycin 1 % gel Commonly known as:  CLINDAGEL   metroNIDAZOLE 500 MG tablet Commonly known as:  FLAGYL     TAKE these medications   CONCEPT OB 130-92.4-1 MG Caps Take 1 tablet by mouth daily.        Katrinka Blazing, [place], CNM 06/09/2018  3:06 PM  4

## 2018-06-11 LAB — GC/CHLAMYDIA PROBE AMP (~~LOC~~) NOT AT ARMC
Chlamydia: NEGATIVE
Neisseria Gonorrhea: NEGATIVE

## 2018-06-11 LAB — HIV ANTIBODY (ROUTINE TESTING W REFLEX): HIV SCREEN 4TH GENERATION: NONREACTIVE

## 2018-07-02 ENCOUNTER — Other Ambulatory Visit: Payer: Self-pay

## 2018-07-02 ENCOUNTER — Inpatient Hospital Stay (HOSPITAL_COMMUNITY)
Admission: AD | Admit: 2018-07-02 | Discharge: 2018-07-02 | Disposition: A | Discharge status: Home / Self Care | Payer: Medicaid Other | Source: Ambulatory Visit | Attending: Obstetrics and Gynecology | Admitting: Obstetrics and Gynecology

## 2018-07-02 ENCOUNTER — Encounter (HOSPITAL_COMMUNITY): Payer: Self-pay | Admitting: Student

## 2018-07-02 DIAGNOSIS — O26899 Other specified pregnancy related conditions, unspecified trimester: Secondary | ICD-10-CM

## 2018-07-02 DIAGNOSIS — R109 Unspecified abdominal pain: Secondary | ICD-10-CM | POA: Insufficient documentation

## 2018-07-02 DIAGNOSIS — Z3A09 9 weeks gestation of pregnancy: Secondary | ICD-10-CM | POA: Insufficient documentation

## 2018-07-02 DIAGNOSIS — O26891 Other specified pregnancy related conditions, first trimester: Secondary | ICD-10-CM | POA: Diagnosis not present

## 2018-07-02 HISTORY — DX: Personal history of other infectious and parasitic diseases: Z86.19

## 2018-07-02 LAB — URINALYSIS, ROUTINE W REFLEX MICROSCOPIC
Bilirubin Urine: NEGATIVE
Glucose, UA: NEGATIVE mg/dL
Hgb urine dipstick: NEGATIVE
Ketones, ur: NEGATIVE mg/dL
NITRITE: NEGATIVE
PROTEIN: NEGATIVE mg/dL
Specific Gravity, Urine: 1.015 (ref 1.005–1.030)
pH: 7 (ref 5.0–8.0)

## 2018-07-02 NOTE — MAU Provider Note (Signed)
Chief Complaint: Abdominal Cramping   First Provider Initiated Contact with Patient 07/02/18 1620     SUBJECTIVE HPI: Joanne Cordova is a 17 y.o. G2P0010 at 945w6d who presents to Maternity Admissions reporting abdominal cramping. Symptoms began this morning and were intense while at work, causing her to leave work early. Since leaving work symptoms have nearly resolved. Denies n/v/d, constipation, dysuria, vaginal discharge, or vaginal bleeding.   Location: lower abdomen Quality: cramping Severity: 3/10 on pain scale Duration: 8 hours Timing: intermittent Modifying factors: none. Hasn't treated symptoms.  Associated signs and symptoms: none.   Past Medical History:  Diagnosis Date  . Hx of chlamydia infection 2018  . Hx of gonorrhea 2018   OB History  Gravida Para Term Preterm AB Living  2       1    SAB TAB Ectopic Multiple Live Births  1            # Outcome Date GA Lbr Len/2nd Weight Sex Delivery Anes PTL Lv  2 Current           1 SAB 2018           Past Surgical History:  Procedure Laterality Date  . NO PAST SURGERIES     Social History   Socioeconomic History  . Marital status: Single    Spouse name: Not on file  . Number of children: Not on file  . Years of education: Not on file  . Highest education level: Not on file  Occupational History  . Not on file  Social Needs  . Financial resource strain: Not on file  . Food insecurity:    Worry: Not on file    Inability: Not on file  . Transportation needs:    Medical: Not on file    Non-medical: Not on file  Tobacco Use  . Smoking status: Never Smoker  . Smokeless tobacco: Never Used  Substance and Sexual Activity  . Alcohol use: Yes    Comment: socially  . Drug use: Yes    Types: Marijuana    Comment: states a couple of years ago  . Sexual activity: Yes    Birth control/protection: None  Lifestyle  . Physical activity:    Days per week: Not on file    Minutes per session: Not on file  . Stress:  Not on file  Relationships  . Social connections:    Talks on phone: Not on file    Gets together: Not on file    Attends religious service: Not on file    Active member of club or organization: Not on file    Attends meetings of clubs or organizations: Not on file    Relationship status: Not on file  . Intimate partner violence:    Fear of current or ex partner: Not on file    Emotionally abused: Not on file    Physically abused: Not on file    Forced sexual activity: Not on file  Other Topics Concern  . Not on file  Social History Narrative  . Not on file   No family history on file. No current facility-administered medications on file prior to encounter.    Current Outpatient Medications on File Prior to Encounter  Medication Sig Dispense Refill  . Prenat w/o A Vit-FeFum-FePo-FA (CONCEPT OB) 130-92.4-1 MG CAPS Take 1 tablet by mouth daily. 30 capsule 12   No Known Allergies  I have reviewed patient's Past Medical Hx, Surgical Hx, Family Hx, Social  Hx, medications and allergies.   Review of Systems  Constitutional: Negative.   Gastrointestinal: Positive for abdominal pain. Negative for constipation, diarrhea, nausea and vomiting.  Genitourinary: Negative.     OBJECTIVE Patient Vitals for the past 24 hrs:  BP Temp Temp src Pulse Resp SpO2 Weight  07/02/18 1548 110/77 98.5 F (36.9 C) Oral 92 18 100 % 62.3 kg   Constitutional: Well-developed, well-nourished female in no acute distress.  Cardiovascular: normal rate & rhythm, no murmur Respiratory: normal rate and effort. Lung sounds clear throughout GI: Abd soft, non-tender, Pos BS x 4. No guarding or rebound tenderness MS: Extremities nontender, no edema, normal ROM Neurologic: Alert and oriented x 4.  GU:      BIMANUAL: No CMT. cervix closed; uterus normal size, no adnexal  tenderness or masses.    LAB RESULTS Results for orders placed or performed during the hospital encounter of 07/02/18 (from the past 24  hour(s))  Urinalysis, Routine w reflex microscopic     Status: Abnormal   Collection Time: 07/02/18  4:13 PM  Result Value Ref Range   Color, Urine YELLOW YELLOW   APPearance TURBID (A) CLEAR   Specific Gravity, Urine 1.015 1.005 - 1.030   pH 7.0 5.0 - 8.0   Glucose, UA NEGATIVE NEGATIVE mg/dL   Hgb urine dipstick NEGATIVE NEGATIVE   Bilirubin Urine NEGATIVE NEGATIVE   Ketones, ur NEGATIVE NEGATIVE mg/dL   Protein, ur NEGATIVE NEGATIVE mg/dL   Nitrite NEGATIVE NEGATIVE   Leukocytes, UA TRACE (A) NEGATIVE   RBC / HPF 0-5 0 - 5 RBC/hpf   WBC, UA 21-50 0 - 5 WBC/hpf   Bacteria, UA MANY (A) NONE SEEN   Squamous Epithelial / LPF 6-10 0 - 5   Mucus PRESENT    Amorphous Crystal PRESENT     IMAGING No results found.  MAU COURSE Orders Placed This Encounter  Procedures  . Culture, OB Urine  . Urinalysis, Routine w reflex microscopic  . Discharge patient   No orders of the defined types were placed in this encounter.   MDM Pt informed that the ultrasound is considered a limited OB ultrasound and is not intended to be a complete ultrasound exam.  Patient also informed that the ultrasound is not being completed with the intent of assessing for fetal or placental anomalies or any pelvic abnormalities.  Explained that the purpose of today's ultrasound is to assess for  viability.  Patient acknowledges the purpose of the exam and the limitations of the study.  Live IUP w/FHR 160s present.   Cervix closed U/w with leuks & bacteria, will send for culture   ASSESSMENT 1. Abdominal pain affecting pregnancy   2. [redacted] weeks gestation of pregnancy     PLAN Discharge home in stable condition. SAB precautions  Allergies as of 07/02/2018   No Known Allergies     Medication List    TAKE these medications   CONCEPT OB 130-92.4-1 MG Caps Take 1 tablet by mouth daily.        Judeth Horn, NP 07/02/2018  5:25 PM

## 2018-07-02 NOTE — Discharge Instructions (Signed)

## 2018-07-02 NOTE — MAU Note (Signed)
Woke up at 9 this morning, having bad cramps in her stomach. Had to leave early because of the cramping.

## 2018-07-03 LAB — CULTURE, OB URINE: CULTURE: NO GROWTH

## 2018-07-15 ENCOUNTER — Encounter: Payer: Self-pay | Admitting: Advanced Practice Midwife

## 2018-07-19 ENCOUNTER — Ambulatory Visit (INDEPENDENT_AMBULATORY_CARE_PROVIDER_SITE_OTHER): Payer: Medicaid Other | Admitting: Obstetrics and Gynecology

## 2018-07-19 ENCOUNTER — Other Ambulatory Visit (HOSPITAL_COMMUNITY)
Admission: RE | Admit: 2018-07-19 | Discharge: 2018-07-19 | Disposition: A | Discharge status: Home / Self Care | Payer: Medicaid Other | Source: Ambulatory Visit | Attending: Obstetrics and Gynecology | Admitting: Obstetrics and Gynecology

## 2018-07-19 ENCOUNTER — Encounter: Payer: Self-pay | Admitting: Obstetrics and Gynecology

## 2018-07-19 DIAGNOSIS — Z3401 Encounter for supervision of normal first pregnancy, first trimester: Secondary | ICD-10-CM | POA: Insufficient documentation

## 2018-07-19 DIAGNOSIS — Z3400 Encounter for supervision of normal first pregnancy, unspecified trimester: Secondary | ICD-10-CM

## 2018-07-19 DIAGNOSIS — Z8759 Personal history of other complications of pregnancy, childbirth and the puerperium: Secondary | ICD-10-CM | POA: Insufficient documentation

## 2018-07-19 NOTE — Progress Notes (Signed)
  Subjective:    Joanne Cordova is a G2P0010 [redacted]w[redacted]d being seen today for her first obstetrical visit.  Her obstetrical history is significant for first pregnancy. Patient does intend to breast feed. Pregnancy history fully reviewed.  Patient reports no complaints.  Vitals:   07/19/18 1030  BP: 113/72  Pulse: 98  Weight: 137 lb 14.4 oz (62.6 kg)    HISTORY: OB History  Gravida Para Term Preterm AB Living  2       1    SAB TAB Ectopic Multiple Live Births  1            # Outcome Date GA Lbr Len/2nd Weight Sex Delivery Anes PTL Lv  2 Current           1 SAB 2018           Past Medical History:  Diagnosis Date  . Hx of chlamydia infection 2018  . Hx of gonorrhea 2018   Past Surgical History:  Procedure Laterality Date  . NO PAST SURGERIES     Family History  Problem Relation Age of Onset  . Cervical cancer Mother   . Diabetes Maternal Grandmother      Exam    Uterus:     Pelvic Exam:    Perineum: No Hemorrhoids, Normal Perineum   Vulva: normal   Vagina:  normal mucosa, normal discharge   pH:    Cervix: nulliparous appearance   Adnexa: normal adnexa and no mass, fullness, tenderness   Bony Pelvis: gynecoid  System: Breast:  normal appearance, no masses or tenderness   Skin: normal coloration and turgor, no rashes    Neurologic: oriented, no focal deficits   Extremities: normal strength, tone, and muscle mass   HEENT extra ocular movement intact   Mouth/Teeth mucous membranes moist, pharynx normal without lesions and dental hygiene good   Neck supple and no masses   Cardiovascular: regular rate and rhythm   Respiratory:  appears well, vitals normal, no respiratory distress, acyanotic, normal RR, chest clear, no wheezing, crepitations, rhonchi, normal symmetric air entry   Abdomen: soft, non-tender; bowel sounds normal; no masses,  no organomegaly   Urinary:       Assessment:    Pregnancy: G2P0010 Patient Active Problem List   Diagnosis Date Noted   . Supervision of normal pregnancy, antepartum 07/19/2018        Plan:     Initial labs drawn. Prenatal vitamins. Problem list reviewed and updated. Genetic Screening discussed : panorama ordered.  Ultrasound discussed; fetal survey: requested.  Follow up in 4 weeks. 50% of 30 min visit spent on counseling and coordination of care.     Shaquel Chavous 07/19/2018

## 2018-07-19 NOTE — Patient Instructions (Signed)
 First Trimester of Pregnancy The first trimester of pregnancy is from week 1 until the end of week 13 (months 1 through 3). A week after a sperm fertilizes an egg, the egg will implant on the wall of the uterus. This embryo will begin to develop into a baby. Genes from you and your partner will form the baby. The female genes will determine whether the baby will be a boy or a girl. At 6-8 weeks, the eyes and face will be formed, and the heartbeat can be seen on ultrasound. At the end of 12 weeks, all the baby's organs will be formed. Now that you are pregnant, you will want to do everything you can to have a healthy baby. Two of the most important things are to get good prenatal care and to follow your health care provider's instructions. Prenatal care is all the medical care you receive before the baby's birth. This care will help prevent, find, and treat any problems during the pregnancy and childbirth. Body changes during your first trimester Your body goes through many changes during pregnancy. The changes vary from woman to woman.  You may gain or lose a couple of pounds at first.  You may feel sick to your stomach (nauseous) and you may throw up (vomit). If the vomiting is uncontrollable, call your health care provider.  You may tire easily.  You may develop headaches that can be relieved by medicines. All medicines should be approved by your health care provider.  You may urinate more often. Painful urination may mean you have a bladder infection.  You may develop heartburn as a result of your pregnancy.  You may develop constipation because certain hormones are causing the muscles that push stool through your intestines to slow down.  You may develop hemorrhoids or swollen veins (varicose veins).  Your breasts may begin to grow larger and become tender. Your nipples may stick out more, and the tissue that surrounds them (areola) may become darker.  Your gums may bleed and may be  sensitive to brushing and flossing.  Dark spots or blotches (chloasma, mask of pregnancy) may develop on your face. This will likely fade after the baby is born.  Your menstrual periods will stop.  You may have a loss of appetite.  You may develop cravings for certain kinds of food.  You may have changes in your emotions from day to day, such as being excited to be pregnant or being concerned that something may go wrong with the pregnancy and baby.  You may have more vivid and strange dreams.  You may have changes in your hair. These can include thickening of your hair, rapid growth, and changes in texture. Some women also have hair loss during or after pregnancy, or hair that feels dry or thin. Your hair will most likely return to normal after your baby is born.  What to expect at prenatal visits During a routine prenatal visit:  You will be weighed to make sure you and the baby are growing normally.  Your blood pressure will be taken.  Your abdomen will be measured to track your baby's growth.  The fetal heartbeat will be listened to between weeks 10 and 14 of your pregnancy.  Test results from any previous visits will be discussed.  Your health care provider may ask you:  How you are feeling.  If you are feeling the baby move.  If you have had any abnormal symptoms, such as leaking fluid, bleeding, severe   headaches, or abdominal cramping.  If you are using any tobacco products, including cigarettes, chewing tobacco, and electronic cigarettes.  If you have any questions.  Other tests that may be performed during your first trimester include:  Blood tests to find your blood type and to check for the presence of any previous infections. The tests will also be used to check for low iron levels (anemia) and protein on red blood cells (Rh antibodies). Depending on your risk factors, or if you previously had diabetes during pregnancy, you may have tests to check for high blood  sugar that affects pregnant women (gestational diabetes).  Urine tests to check for infections, diabetes, or protein in the urine.  An ultrasound to confirm the proper growth and development of the baby.  Fetal screens for spinal cord problems (spina bifida) and Down syndrome.  HIV (human immunodeficiency virus) testing. Routine prenatal testing includes screening for HIV, unless you choose not to have this test.  You may need other tests to make sure you and the baby are doing well.  Follow these instructions at home: Medicines  Follow your health care provider's instructions regarding medicine use. Specific medicines may be either safe or unsafe to take during pregnancy.  Take a prenatal vitamin that contains at least 600 micrograms (mcg) of folic acid.  If you develop constipation, try taking a stool softener if your health care provider approves. Eating and drinking  Eat a balanced diet that includes fresh fruits and vegetables, whole grains, good sources of protein such as meat, eggs, or tofu, and low-fat dairy. Your health care provider will help you determine the amount of weight gain that is right for you.  Avoid raw meat and uncooked cheese. These carry germs that can cause birth defects in the baby.  Eating four or five small meals rather than three large meals a day may help relieve nausea and vomiting. If you start to feel nauseous, eating a few soda crackers can be helpful. Drinking liquids between meals, instead of during meals, also seems to help ease nausea and vomiting.  Limit foods that are high in fat and processed sugars, such as fried and sweet foods.  To prevent constipation: ? Eat foods that are high in fiber, such as fresh fruits and vegetables, whole grains, and beans. ? Drink enough fluid to keep your urine clear or pale yellow. Activity  Exercise only as directed by your health care provider. Most women can continue their usual exercise routine during  pregnancy. Try to exercise for 30 minutes at least 5 days a week. Exercising will help you: ? Control your weight. ? Stay in shape. ? Be prepared for labor and delivery.  Experiencing pain or cramping in the lower abdomen or lower back is a good sign that you should stop exercising. Check with your health care provider before continuing with normal exercises.  Try to avoid standing for long periods of time. Move your legs often if you must stand in one place for a long time.  Avoid heavy lifting.  Wear low-heeled shoes and practice good posture.  You may continue to have sex unless your health care provider tells you not to. Relieving pain and discomfort  Wear a good support bra to relieve breast tenderness.  Take warm sitz baths to soothe any pain or discomfort caused by hemorrhoids. Use hemorrhoid cream if your health care provider approves.  Rest with your legs elevated if you have leg cramps or low back pain.  If you   develop varicose veins in your legs, wear support hose. Elevate your feet for 15 minutes, 3-4 times a day. Limit salt in your diet. Prenatal care  Schedule your prenatal visits by the twelfth week of pregnancy. They are usually scheduled monthly at first, then more often in the last 2 months before delivery.  Write down your questions. Take them to your prenatal visits.  Keep all your prenatal visits as told by your health care provider. This is important. Safety  Wear your seat belt at all times when driving.  Make a list of emergency phone numbers, including numbers for family, friends, the hospital, and police and fire departments. General instructions  Ask your health care provider for a referral to a local prenatal education class. Begin classes no later than the beginning of month 6 of your pregnancy.  Ask for help if you have counseling or nutritional needs during pregnancy. Your health care provider can offer advice or refer you to specialists for help  with various needs.  Do not use hot tubs, steam rooms, or saunas.  Do not douche or use tampons or scented sanitary pads.  Do not cross your legs for long periods of time.  Avoid cat litter boxes and soil used by cats. These carry germs that can cause birth defects in the baby and possibly loss of the fetus by miscarriage or stillbirth.  Avoid all smoking, herbs, alcohol, and medicines not prescribed by your health care provider. Chemicals in these products affect the formation and growth of the baby.  Do not use any products that contain nicotine or tobacco, such as cigarettes and e-cigarettes. If you need help quitting, ask your health care provider. You may receive counseling support and other resources to help you quit.  Schedule a dentist appointment. At home, brush your teeth with a soft toothbrush and be gentle when you floss. Contact a health care provider if:  You have dizziness.  You have mild pelvic cramps, pelvic pressure, or nagging pain in the abdominal area.  You have persistent nausea, vomiting, or diarrhea.  You have a bad smelling vaginal discharge.  You have pain when you urinate.  You notice increased swelling in your face, hands, legs, or ankles.  You are exposed to fifth disease or chickenpox.  You are exposed to German measles (rubella) and have never had it. Get help right away if:  You have a fever.  You are leaking fluid from your vagina.  You have spotting or bleeding from your vagina.  You have severe abdominal cramping or pain.  You have rapid weight gain or loss.  You vomit blood or material that looks like coffee grounds.  You develop a severe headache.  You have shortness of breath.  You have any kind of trauma, such as from a fall or a car accident. Summary  The first trimester of pregnancy is from week 1 until the end of week 13 (months 1 through 3).  Your body goes through many changes during pregnancy. The changes vary from  woman to woman.  You will have routine prenatal visits. During those visits, your health care provider will examine you, discuss any test results you may have, and talk with you about how you are feeling. This information is not intended to replace advice given to you by your health care provider. Make sure you discuss any questions you have with your health care provider. Document Released: 09/19/2001 Document Revised: 09/06/2016 Document Reviewed: 09/06/2016 [employer] Interactive Patient Education  2018   [employer]   Second Trimester of Pregnancy The second trimester is from week 14 through week 27 (months 4 through 6). The second trimester is often a time when you feel your best. Your body has adjusted to being pregnant, and you begin to feel better physically. Usually, morning sickness has lessened or quit completely, you may have more energy, and you may have an increase in appetite. The second trimester is also a time when the fetus is growing rapidly. At the end of the sixth month, the fetus is about 9 inches long and weighs about 1 pounds. You will likely begin to feel the baby move (quickening) between 16 and 20 weeks of pregnancy. Body changes during your second trimester Your body continues to go through many changes during your second trimester. The changes vary from woman to woman.  Your weight will continue to increase. You will notice your lower abdomen bulging out.  You may begin to get stretch marks on your hips, abdomen, and breasts.  You may develop headaches that can be relieved by medicines. The medicines should be approved by your health care provider.  You may urinate more often because the fetus is pressing on your bladder.  You may develop or continue to have heartburn as a result of your pregnancy.  You may develop constipation because certain hormones are causing the muscles that push waste through your intestines to slow down.  You may develop hemorrhoids or  swollen, bulging veins (varicose veins).  You may have back pain. This is caused by: ? Weight gain. ? Pregnancy hormones that are relaxing the joints in your pelvis. ? A shift in weight and the muscles that support your balance.  Your breasts will continue to grow and they will continue to become tender.  Your gums may bleed and may be sensitive to brushing and flossing.  Dark spots or blotches (chloasma, mask of pregnancy) may develop on your face. This will likely fade after the baby is born.  A dark line from your belly button to the pubic area (linea nigra) may appear. This will likely fade after the baby is born.  You may have changes in your hair. These can include thickening of your hair, rapid growth, and changes in texture. Some women also have hair loss during or after pregnancy, or hair that feels dry or thin. Your hair will most likely return to normal after your baby is born.  What to expect at prenatal visits During a routine prenatal visit:  You will be weighed to make sure you and the fetus are growing normally.  Your blood pressure will be taken.  Your abdomen will be measured to track your baby's growth.  The fetal heartbeat will be listened to.  Any test results from the previous visit will be discussed.  Your health care provider may ask you:  How you are feeling.  If you are feeling the baby move.  If you have had any abnormal symptoms, such as leaking fluid, bleeding, severe headaches, or abdominal cramping.  If you are using any tobacco products, including cigarettes, chewing tobacco, and electronic cigarettes.  If you have any questions.  Other tests that may be performed during your second trimester include:  Blood tests that check for: ? Low iron levels (anemia). ? High blood sugar that affects pregnant women (gestational diabetes) between 24 and 28 weeks. ? Rh antibodies. This is to check for a protein on red blood cells (Rh factor).  Urine  tests to   check for infections, diabetes, or protein in the urine.  An ultrasound to confirm the proper growth and development of the baby.  An amniocentesis to check for possible genetic problems.  Fetal screens for spina bifida and Down syndrome.  HIV (human immunodeficiency virus) testing. Routine prenatal testing includes screening for HIV, unless you choose not to have this test.  Follow these instructions at home: Medicines  Follow your health care provider's instructions regarding medicine use. Specific medicines may be either safe or unsafe to take during pregnancy.  Take a prenatal vitamin that contains at least 600 micrograms (mcg) of folic acid.  If you develop constipation, try taking a stool softener if your health care provider approves. Eating and drinking  Eat a balanced diet that includes fresh fruits and vegetables, whole grains, good sources of protein such as meat, eggs, or tofu, and low-fat dairy. Your health care provider will help you determine the amount of weight gain that is right for you.  Avoid raw meat and uncooked cheese. These carry germs that can cause birth defects in the baby.  If you have low calcium intake from food, talk to your health care provider about whether you should take a daily calcium supplement.  Limit foods that are high in fat and processed sugars, such as fried and sweet foods.  To prevent constipation: ? Drink enough fluid to keep your urine clear or pale yellow. ? Eat foods that are high in fiber, such as fresh fruits and vegetables, whole grains, and beans. Activity  Exercise only as directed by your health care provider. Most women can continue their usual exercise routine during pregnancy. Try to exercise for 30 minutes at least 5 days a week. Stop exercising if you experience uterine contractions.  Avoid heavy lifting, wear low heel shoes, and practice good posture.  A sexual relationship may be continued unless your health  care provider directs you otherwise. Relieving pain and discomfort  Wear a good support bra to prevent discomfort from breast tenderness.  Take warm sitz baths to soothe any pain or discomfort caused by hemorrhoids. Use hemorrhoid cream if your health care provider approves.  Rest with your legs elevated if you have leg cramps or low back pain.  If you develop varicose veins, wear support hose. Elevate your feet for 15 minutes, 3-4 times a day. Limit salt in your diet. Prenatal Care  Write down your questions. Take them to your prenatal visits.  Keep all your prenatal visits as told by your health care provider. This is important. Safety  Wear your seat belt at all times when driving.  Make a list of emergency phone numbers, including numbers for family, friends, the hospital, and police and fire departments. General instructions  Ask your health care provider for a referral to a local prenatal education class. Begin classes no later than the beginning of month 6 of your pregnancy.  Ask for help if you have counseling or nutritional needs during pregnancy. Your health care provider can offer advice or refer you to specialists for help with various needs.  Do not use hot tubs, steam rooms, or saunas.  Do not douche or use tampons or scented sanitary pads.  Do not cross your legs for long periods of time.  Avoid cat litter boxes and soil used by cats. These carry germs that can cause birth defects in the baby and possibly loss of the fetus by miscarriage or stillbirth.  Avoid all smoking, herbs, alcohol, and unprescribed drugs. Chemicals   in these products can affect the formation and growth of the baby.  Do not use any products that contain nicotine or tobacco, such as cigarettes and e-cigarettes. If you need help quitting, ask your health care provider.  Visit your dentist if you have not gone yet during your pregnancy. Use a soft toothbrush to brush your teeth and be gentle when  you floss. Contact a health care provider if:  You have dizziness.  You have mild pelvic cramps, pelvic pressure, or nagging pain in the abdominal area.  You have persistent nausea, vomiting, or diarrhea.  You have a bad smelling vaginal discharge.  You have pain when you urinate. Get help right away if:  You have a fever.  You are leaking fluid from your vagina.  You have spotting or bleeding from your vagina.  You have severe abdominal cramping or pain.  You have rapid weight gain or weight loss.  You have shortness of breath with chest pain.  You notice sudden or extreme swelling of your face, hands, ankles, feet, or legs.  You have not felt your baby move in over an hour.  You have severe headaches that do not go away when you take medicine.  You have vision changes. Summary  The second trimester is from week 14 through week 27 (months 4 through 6). It is also a time when the fetus is growing rapidly.  Your body goes through many changes during pregnancy. The changes vary from woman to woman.  Avoid all smoking, herbs, alcohol, and unprescribed drugs. These chemicals affect the formation and growth your baby.  Do not use any tobacco products, such as cigarettes, chewing tobacco, and e-cigarettes. If you need help quitting, ask your health care provider.  Contact your health care provider if you have any questions. Keep all prenatal visits as told by your health care provider. This is important. This information is not intended to replace advice given to you by your health care provider. Make sure you discuss any questions you have with your health care provider. Document Released: 09/19/2001 Document Revised: 10/31/2016 Document Reviewed: 10/31/2016 [employer] Interactive Patient Education  2018 [employer]   Contraception Choices Contraception, also called birth control, refers to methods or devices that prevent pregnancy. Hormonal methods Contraceptive  implant A contraceptive implant is a thin, plastic tube that contains a hormone. It is inserted into the upper part of the arm. It can remain in place for up to 3 years. Progestin-only injections Progestin-only injections are injections of progestin, a synthetic form of the hormone progesterone. They are given every 3 months by a health care provider. Birth control pills Birth control pills are pills that contain hormones that prevent pregnancy. They must be taken once a day, preferably at the same time each day. Birth control patch The birth control patch contains hormones that prevent pregnancy. It is placed on the skin and must be changed once a week for three weeks and removed on the fourth week. A prescription is needed to use this method of contraception. Vaginal ring A vaginal ring contains hormones that prevent pregnancy. It is placed in the vagina for three weeks and removed on the fourth week. After that, the process is repeated with a new ring. A prescription is needed to use this method of contraception. Emergency contraceptive Emergency contraceptives prevent pregnancy after unprotected sex. They come in pill form and can be taken up to 5 days after sex. They work best the sooner they are taken after having   sex. Most emergency contraceptives are available without a prescription. This method should not be used as your only form of birth control. Barrier methods Female condom A female condom is a thin sheath that is worn over the penis during sex. Condoms keep sperm from going inside a woman's body. They can be used with a spermicide to increase their effectiveness. They should be disposed after a single use. Female condom A female condom is a soft, loose-fitting sheath that is put into the vagina before sex. The condom keeps sperm from going inside a woman's body. They should be disposed after a single use. Diaphragm A diaphragm is a soft, dome-shaped barrier. It is inserted into the vagina  before sex, along with a spermicide. The diaphragm blocks sperm from entering the uterus, and the spermicide kills sperm. A diaphragm should be left in the vagina for 6-8 hours after sex and removed within 24 hours. A diaphragm is prescribed and fitted by a health care provider. A diaphragm should be replaced every 1-2 years, after giving birth, after gaining more than 15 lb (6.8 kg), and after pelvic surgery. Cervical cap A cervical cap is a round, soft latex or plastic cup that fits over the cervix. It is inserted into the vagina before sex, along with spermicide. It blocks sperm from entering the uterus. The cap should be left in place for 6-8 hours after sex and removed within 48 hours. A cervical cap must be prescribed and fitted by a health care provider. It should be replaced every 2 years. Sponge A sponge is a soft, circular piece of polyurethane foam with spermicide on it. The sponge helps block sperm from entering the uterus, and the spermicide kills sperm. To use it, you make it wet and then insert it into the vagina. It should be inserted before sex, left in for at least 6 hours after sex, and removed and thrown away within 30 hours. Spermicides Spermicides are chemicals that kill or block sperm from entering the cervix and uterus. They can come as a cream, jelly, suppository, foam, or tablet. A spermicide should be inserted into the vagina with an applicator at least 10-15 minutes before sex to allow time for it to work. The process must be repeated every time you have sex. Spermicides do not require a prescription. Intrauterine contraception Intrauterine device (IUD) An IUD is a T-shaped device that is put in a woman's uterus. There are two types:  Hormone IUD.This type contains progestin, a synthetic form of the hormone progesterone. This type can stay in place for 3-5 years.  Copper IUD.This type is wrapped in copper wire. It can stay in place for 10 years.  Permanent methods of  contraception Female tubal ligation In this method, a woman's fallopian tubes are sealed, tied, or blocked during surgery to prevent eggs from traveling to the uterus. Hysteroscopic sterilization In this method, a small, flexible insert is placed into each fallopian tube. The inserts cause scar tissue to form in the fallopian tubes and block them, so sperm cannot reach an egg. The procedure takes about 3 months to be effective. Another form of birth control must be used during those 3 months. Female sterilization This is a procedure to tie off the tubes that carry sperm (vasectomy). After the procedure, the man can still ejaculate fluid (semen). Natural planning methods Natural family planning In this method, a couple does not have sex on days when the woman could become pregnant. Calendar method This means keeping track of the   length of each menstrual cycle, identifying the days when pregnancy can happen, and not having sex on those days. Ovulation method In this method, a couple avoids sex during ovulation. Symptothermal method This method involves not having sex during ovulation. The woman typically checks for ovulation by watching changes in her temperature and in the consistency of cervical mucus. Post-ovulation method In this method, a couple waits to have sex until after ovulation. Summary  Contraception, also called birth control, means methods or devices that prevent pregnancy.  Hormonal methods of contraception include implants, injections, pills, patches, vaginal rings, and emergency contraceptives.  Barrier methods of contraception can include female condoms, female condoms, diaphragms, cervical caps, sponges, and spermicides.  There are two types of IUDs (intrauterine devices). An IUD can be put in a woman's uterus to prevent pregnancy for 3-5 years.  Permanent sterilization can be done through a procedure for males, females, or both.  Natural family planning methods involve  not having sex on days when the woman could become pregnant. This information is not intended to replace advice given to you by your health care provider. Make sure you discuss any questions you have with your health care provider. Document Released: 09/25/2005 Document Revised: 10/28/2016 Document Reviewed: 10/28/2016 Elsevier Interactive Patient Education  2018 Elsevier Inc.   Breastfeeding Choosing to breastfeed is one of the best decisions you can make for yourself and your baby. A change in hormones during pregnancy causes your breasts to make breast milk in your milk-producing glands. Hormones prevent breast milk from being released before your baby is born. They also prompt milk flow after birth. Once breastfeeding has begun, thoughts of your baby, as well as his or her sucking or crying, can stimulate the release of milk from your milk-producing glands. Benefits of breastfeeding Research shows that breastfeeding offers many health benefits for infants and mothers. It also offers a cost-free and convenient way to feed your baby. For your baby  Your first milk (colostrum) helps your baby's digestive system to function better.  Special cells in your milk (antibodies) help your baby to fight off infections.  Breastfed babies are less likely to develop asthma, allergies, obesity, or type 2 diabetes. They are also at lower risk for sudden infant death syndrome (SIDS).  Nutrients in breast milk are better able to meet your baby's needs compared to infant formula.  Breast milk improves your baby's brain development. For you  Breastfeeding helps to create a very special bond between you and your baby.  Breastfeeding is convenient. Breast milk costs nothing and is always available at the correct temperature.  Breastfeeding helps to burn calories. It helps you to lose the weight that you gained during pregnancy.  Breastfeeding makes your uterus return faster to its size before pregnancy.  It also slows bleeding (lochia) after you give birth.  Breastfeeding helps to lower your risk of developing type 2 diabetes, osteoporosis, rheumatoid arthritis, cardiovascular disease, and breast, ovarian, uterine, and endometrial cancer later in life. Breastfeeding basics Starting breastfeeding  Find a comfortable place to sit or lie down, with your neck and back well-supported.  Place a pillow or a rolled-up blanket under your baby to bring him or her to the level of your breast (if you are seated). Nursing pillows are specially designed to help support your arms and your baby while you breastfeed.  Make sure that your baby's tummy (abdomen) is facing your abdomen.  Gently massage your breast. With your fingertips, massage from the outer edges of   your breast inward toward the nipple. This encourages milk flow. If your milk flows slowly, you may need to continue this action during the feeding.  Support your breast with 4 fingers underneath and your thumb above your nipple (make the letter "C" with your hand). Make sure your fingers are well away from your nipple and your baby's mouth.  Stroke your baby's lips gently with your finger or nipple.  When your baby's mouth is open wide enough, quickly bring your baby to your breast, placing your entire nipple and as much of the areola as possible into your baby's mouth. The areola is the colored area around your nipple. ? More areola should be visible above your baby's upper lip than below the lower lip. ? Your baby's lips should be opened and extended outward (flanged) to ensure an adequate, comfortable latch. ? Your baby's tongue should be between his or her lower gum and your breast.  Make sure that your baby's mouth is correctly positioned around your nipple (latched). Your baby's lips should create a seal on your breast and be turned out (everted).  It is common for your baby to suck about 2-3 minutes in order to start the flow of breast  milk. Latching Teaching your baby how to latch onto your breast properly is very important. An improper latch can cause nipple pain, decreased milk supply, and poor weight gain in your baby. Also, if your baby is not latched onto your nipple properly, he or she may swallow some air during feeding. This can make your baby fussy. Burping your baby when you switch breasts during the feeding can help to get rid of the air. However, teaching your baby to latch on properly is still the best way to prevent fussiness from swallowing air while breastfeeding. Signs that your baby has successfully latched onto your nipple  Silent tugging or silent sucking, without causing you pain. Infant's lips should be extended outward (flanged).  Swallowing heard between every 3-4 sucks once your milk has started to flow (after your let-down milk reflex occurs).  Muscle movement above and in front of his or her ears while sucking.  Signs that your baby has not successfully latched onto your nipple  Sucking sounds or smacking sounds from your baby while breastfeeding.  Nipple pain.  If you think your baby has not latched on correctly, slip your finger into the corner of your baby's mouth to break the suction and place it between your baby's gums. Attempt to start breastfeeding again. Signs of successful breastfeeding Signs from your baby  Your baby will gradually decrease the number of sucks or will completely stop sucking.  Your baby will fall asleep.  Your baby's body will relax.  Your baby will retain a small amount of milk in his or her mouth.  Your baby will let go of your breast by himself or herself.  Signs from you  Breasts that have increased in firmness, weight, and size 1-3 hours after feeding.  Breasts that are softer immediately after breastfeeding.  Increased milk volume, as well as a change in milk consistency and color by the fifth day of breastfeeding.  Nipples that are not sore,  cracked, or bleeding.  Signs that your baby is getting enough milk  Wetting at least 1-2 diapers during the first 24 hours after birth.  Wetting at least 5-6 diapers every 24 hours for the first week after birth. The urine should be clear or pale yellow by the age of 5   days.  Wetting 6-8 diapers every 24 hours as your baby continues to grow and develop.  At least 3 stools in a 24-hour period by the age of 5 days. The stool should be soft and yellow.  At least 3 stools in a 24-hour period by the age of 7 days. The stool should be seedy and yellow.  No loss of weight greater than 10% of birth weight during the first 3 days of life.  Average weight gain of 4-7 oz (113-198 g) per week after the age of 4 days.  Consistent daily weight gain by the age of 5 days, without weight loss after the age of 2 weeks. After a feeding, your baby may spit up a small amount of milk. This is normal. Breastfeeding frequency and duration Frequent feeding will help you make more milk and can prevent sore nipples and extremely full breasts (breast engorgement). Breastfeed when you feel the need to reduce the fullness of your breasts or when your baby shows signs of hunger. This is called "breastfeeding on demand." Signs that your baby is hungry include:  Increased alertness, activity, or restlessness.  Movement of the head from side to side.  Opening of the mouth when the corner of the mouth or cheek is stroked (rooting).  Increased sucking sounds, smacking lips, cooing, sighing, or squeaking.  Hand-to-mouth movements and sucking on fingers or hands.  Fussing or crying.  Avoid introducing a pacifier to your baby in the first 4-6 weeks after your baby is born. After this time, you may choose to use a pacifier. Research has shown that pacifier use during the first year of a baby's life decreases the risk of sudden infant death syndrome (SIDS). Allow your baby to feed on each breast as long as he or she  wants. When your baby unlatches or falls asleep while feeding from the first breast, offer the second breast. Because newborns are often sleepy in the first few weeks of life, you may need to awaken your baby to get him or her to feed. Breastfeeding times will vary from baby to baby. However, the following rules can serve as a guide to help you make sure that your baby is properly fed:  Newborns (babies 4 weeks of age or younger) may breastfeed every 1-3 hours.  Newborns should not go without breastfeeding for longer than 3 hours during the day or 5 hours during the night.  You should breastfeed your baby a minimum of 8 times in a 24-hour period.  Breast milk pumping Pumping and storing breast milk allows you to make sure that your baby is exclusively fed your breast milk, even at times when you are unable to breastfeed. This is especially important if you go back to work while you are still breastfeeding, or if you are not able to be present during feedings. Your lactation consultant can help you find a method of pumping that works best for you and give you guidelines about how long it is safe to store breast milk. Caring for your breasts while you breastfeed Nipples can become dry, cracked, and sore while breastfeeding. The following recommendations can help keep your breasts moisturized and healthy:  Avoid using soap on your nipples.  Wear a supportive bra designed especially for nursing. Avoid wearing underwire-style bras or extremely tight bras (sports bras).  Air-dry your nipples for 3-4 minutes after each feeding.  Use only cotton bra pads to absorb leaked breast milk. Leaking of breast milk between feedings is normal.    Use lanolin on your nipples after breastfeeding. Lanolin helps to maintain your skin's normal moisture barrier. Pure lanolin is not harmful (not toxic) to your baby. You may also hand express a few drops of breast milk and gently massage that milk into your nipples and  allow the milk to air-dry.  In the first few weeks after giving birth, some women experience breast engorgement. Engorgement can make your breasts feel heavy, warm, and tender to the touch. Engorgement peaks within 3-5 days after you give birth. The following recommendations can help to ease engorgement:  Completely empty your breasts while breastfeeding or pumping. You may want to start by applying warm, moist heat (in the shower or with warm, water-soaked hand towels) just before feeding or pumping. This increases circulation and helps the milk flow. If your baby does not completely empty your breasts while breastfeeding, pump any extra milk after he or she is finished.  Apply ice packs to your breasts immediately after breastfeeding or pumping, unless this is too uncomfortable for you. To do this: ? Put ice in a plastic bag. ? Place a towel between your skin and the bag. ? Leave the ice on for 20 minutes, 2-3 times a day.  Make sure that your baby is latched on and positioned properly while breastfeeding.  If engorgement persists after 48 hours of following these recommendations, contact your health care provider or a lactation consultant. Overall health care recommendations while breastfeeding  Eat 3 healthy meals and 3 snacks every day. Well-nourished mothers who are breastfeeding need an additional 450-500 calories a day. You can meet this requirement by increasing the amount of a balanced diet that you eat.  Drink enough water to keep your urine pale yellow or clear.  Rest often, relax, and continue to take your prenatal vitamins to prevent fatigue, stress, and low vitamin and mineral levels in your body (nutrient deficiencies).  Do not use any products that contain nicotine or tobacco, such as cigarettes and e-cigarettes. Your baby may be harmed by chemicals from cigarettes that pass into breast milk and exposure to secondhand smoke. If you need help quitting, ask your health care  provider.  Avoid alcohol.  Do not use illegal drugs or marijuana.  Talk with your health care provider before taking any medicines. These include over-the-counter and prescription medicines as well as vitamins and herbal supplements. Some medicines that may be harmful to your baby can pass through breast milk.  It is possible to become pregnant while breastfeeding. If birth control is desired, ask your health care provider about options that will be safe while breastfeeding your baby. Where to find more information: [employer]: www.llli.org Contact a health care provider if:  You feel like you want to stop breastfeeding or have become frustrated with breastfeeding.  Your nipples are cracked or bleeding.  Your breasts are red, tender, or warm.  You have: ? Painful breasts or nipples. ? A swollen area on either breast. ? A fever or chills. ? Nausea or vomiting. ? Drainage other than breast milk from your nipples.  Your breasts do not become full before feedings by the fifth day after you give birth.  You feel sad and depressed.  Your baby is: ? Too sleepy to eat well. ? Having trouble sleeping. ? More than 1 week old and wetting fewer than 6 diapers in a 24-hour period. ? Not gaining weight by 5 days of age.  Your baby has fewer than 3 stools in a   24-hour period.  Your baby's skin or the white parts of his or her eyes become yellow. Get help right away if:  Your baby is overly tired (lethargic) and does not want to wake up and feed.  Your baby develops an unexplained fever. Summary  Breastfeeding offers many health benefits for infant and mothers.  Try to breastfeed your infant when he or she shows early signs of hunger.  Gently tickle or stroke your baby's lips with your finger or nipple to allow the baby to open his or her mouth. Bring the baby to your breast. Make sure that much of the areola is in your baby's mouth. Offer one side and burp the  baby before you offer the other side.  Talk with your health care provider or lactation consultant if you have questions or you face problems as you breastfeed. This information is not intended to replace advice given to you by your health care provider. Make sure you discuss any questions you have with your health care provider. Document Released: 09/25/2005 Document Revised: 10/27/2016 Document Reviewed: 10/27/2016 Elsevier Interactive Patient Education  2018 Elsevier Inc.  

## 2018-07-21 LAB — URINE CULTURE, OB REFLEX

## 2018-07-21 LAB — CULTURE, OB URINE

## 2018-07-22 LAB — URINE CYTOLOGY ANCILLARY ONLY
Chlamydia: NEGATIVE
NEISSERIA GONORRHEA: NEGATIVE
Trichomonas: NEGATIVE

## 2018-07-23 LAB — OBSTETRIC PANEL, INCLUDING HIV
Antibody Screen: NEGATIVE
BASOS ABS: 0 10*3/uL (ref 0.0–0.2)
Basos: 0 %
EOS (ABSOLUTE): 0 10*3/uL (ref 0.0–0.4)
EOS: 0 %
HEMOGLOBIN: 13.1 g/dL (ref 11.1–15.9)
HEP B S AG: NEGATIVE
HIV Screen 4th Generation wRfx: NONREACTIVE
Hematocrit: 38.6 % (ref 34.0–46.6)
IMMATURE GRANS (ABS): 0 10*3/uL (ref 0.0–0.1)
IMMATURE GRANULOCYTES: 0 %
LYMPHS ABS: 1.3 10*3/uL (ref 0.7–3.1)
LYMPHS: 18 %
MCH: 29.6 pg (ref 26.6–33.0)
MCHC: 33.9 g/dL (ref 31.5–35.7)
MCV: 87 fL (ref 79–97)
MONOS ABS: 0.5 10*3/uL (ref 0.1–0.9)
Monocytes: 7 %
NEUTROS PCT: 75 %
Neutrophils Absolute: 5.3 10*3/uL (ref 1.4–7.0)
PLATELETS: 216 10*3/uL (ref 150–450)
RBC: 4.43 x10E6/uL (ref 3.77–5.28)
RDW: 12.4 % (ref 12.3–15.4)
RPR: NONREACTIVE
Rh Factor: POSITIVE
Rubella Antibodies, IGG: 6.79 index (ref >0.99)
WBC: 7.1 10*3/uL (ref 3.4–10.8)

## 2018-07-23 LAB — HEMOGLOBINOPATHY EVALUATION
HEMOGLOBIN A2 QUANTITATION: 2.2 % (ref 1.8–3.2)
HEMOGLOBIN F QUANTITATION: 0 % (ref 0.0–2.0)
HGB C: 0 %
HGB S: 0 %
HGB VARIANT: 0 %
Hgb A: 97.8 % (ref 96.4–98.8)

## 2018-07-25 ENCOUNTER — Encounter: Payer: Self-pay | Admitting: Obstetrics and Gynecology

## 2018-07-29 LAB — CYSTIC FIBROSIS MUTATION 97: GENE DIS ANAL CARRIER INTERP BLD/T-IMP: NOT DETECTED

## 2018-07-30 LAB — SMN1 COPY NUMBER ANALYSIS (SMA CARRIER SCREENING)

## 2018-08-14 ENCOUNTER — Encounter: Payer: Medicaid Other | Admitting: Advanced Practice Midwife

## 2018-08-15 ENCOUNTER — Ambulatory Visit (INDEPENDENT_AMBULATORY_CARE_PROVIDER_SITE_OTHER): Payer: Medicaid Other | Admitting: Advanced Practice Midwife

## 2018-08-15 VITALS — BP 131/75 | HR 100 | Wt 144.5 lb

## 2018-08-15 DIAGNOSIS — Z3402 Encounter for supervision of normal first pregnancy, second trimester: Secondary | ICD-10-CM

## 2018-08-15 NOTE — Progress Notes (Signed)
   PRENATAL VISIT NOTE  Subjective:  Joanne Cordova is a 17 y.o. G2P0010 at [redacted]w[redacted]d being seen today for ongoing prenatal care.  She is currently monitored for the following issues for this low-risk pregnancy and has Supervision of normal pregnancy, antepartum on their problem list.  Patient reports no complaints.  Contractions: Not present. Vag. Bleeding: None.   . Denies leaking of fluid.   The following portions of the patient's history were reviewed and updated as appropriate: allergies, current medications, past family history, past medical history, past social history, past surgical history and problem list. Problem list updated.  Objective:   Vitals:   08/15/18 0833  BP: 131/75  Pulse: 100  Weight: 144 lb 8 oz (65.5 kg)    Fetal Status: Fetal Heart Rate (bpm): 148         General:  Alert, oriented and cooperative. Patient is in no acute distress.  Skin: Skin is warm and dry. No rash noted.   Cardiovascular: Normal heart rate noted  Respiratory: Normal respiratory effort, no problems with respiration noted  Abdomen: Soft, gravid, appropriate for gestational age.  Pain/Pressure: Absent     Pelvic: Cervical exam deferred        Extremities: Normal range of motion.  Edema: None  Mental Status: Normal mood and affect. Normal behavior. Normal judgment and thought content.   Assessment and Plan:  Pregnancy: G2P0010 at [redacted]w[redacted]d  1. Encounter for supervision of normal first pregnancy in second trimester --No complaints or concerns, continue routine care --Reviewed Panorama results --Declined AFP today --Patient will be 17 at time of delivery. Unsure about contraception. Discussed grant for [facility], LARCs, side effects -- [place] MFM OB COMP + 14 WK; Future  Preterm labor symptoms and general obstetric precautions including but not limited to vaginal bleeding, contractions, leaking of fluid and fetal movement were reviewed in detail with the patient. Please refer to After  Visit Summary for other counseling recommendations.  Return in about 4 weeks (around 09/12/2018).  Future Appointments  Date Time Provider Department Center  09/06/2018  9:00 AM [facility] [place] 3 [facility]  09/12/2018 10:00 AM Sharyon Cable, CNM [facility] None    [place], [place]

## 2018-08-15 NOTE — Patient Instructions (Signed)
Second Trimester of Pregnancy The second trimester is from week 13 through week 28, month 4 through 6. This is often the time in pregnancy that you feel your best. Often times, morning sickness has lessened or quit. You may have more energy, and you may get hungry more often. Your unborn baby (fetus) is growing rapidly. At the end of the sixth month, he or she is about 9 inches long and weighs about 1 pounds. You will likely feel the baby move (quickening) between 18 and 20 weeks of pregnancy. Follow these instructions at home:  Avoid all smoking, herbs, and alcohol. Avoid drugs not approved by your doctor.  Do not use any tobacco products, including cigarettes, chewing tobacco, and electronic cigarettes. If you need help quitting, ask your doctor. You may get counseling or other support to help you quit.  Only take medicine as told by your doctor. Some medicines are safe and some are not during pregnancy.  Exercise only as told by your doctor. Stop exercising if you start having cramps.  Eat regular, healthy meals.  Wear a good support bra if your breasts are tender.  Do not use hot tubs, steam rooms, or saunas.  Wear your seat belt when driving.  Avoid raw meat, uncooked cheese, and liter boxes and soil used by cats.  Take your prenatal vitamins.  Take 1500-2000 milligrams of calcium daily starting at the 20th week of pregnancy until you deliver your baby.  Try taking medicine that helps you poop (stool softener) as needed, and if your doctor approves. Eat more fiber by eating fresh fruit, vegetables, and whole grains. Drink enough fluids to keep your pee (urine) clear or pale yellow.  Take warm water baths (sitz baths) to soothe pain or discomfort caused by hemorrhoids. Use hemorrhoid cream if your doctor approves.  If you have puffy, bulging veins (varicose veins), wear support hose. Raise (elevate) your feet for 15 minutes, 3-4 times a day. Limit salt in your diet.  Avoid heavy  lifting, wear low heals, and sit up straight.  Rest with your legs raised if you have leg cramps or low back pain.  Visit your dentist if you have not gone during your pregnancy. Use a soft toothbrush to brush your teeth. Be gentle when you floss.  You can have sex (intercourse) unless your doctor tells you not to.  Go to your doctor visits. Get help if:  You feel dizzy.  You have mild cramps or pressure in your lower belly (abdomen).  You have a nagging pain in your belly area.  You continue to feel sick to your stomach (nauseous), throw up (vomit), or have watery poop (diarrhea).  You have bad smelling fluid coming from your vagina.  You have pain with peeing (urination). Get help right away if:  You have a fever.  You are leaking fluid from your vagina.  You have spotting or bleeding from your vagina.  You have severe belly cramping or pain.  You lose or gain weight rapidly.  You have trouble catching your breath and have chest pain.  You notice sudden or extreme puffiness (swelling) of your face, hands, ankles, feet, or legs.  You have not felt the baby move in over an hour.  You have severe headaches that do not go away with medicine.  You have vision changes. This information is not intended to replace advice given to you by your health care provider. Make sure you discuss any questions you have with your health care   provider. Document Released: 12/20/2009 Document Revised: 03/02/2016 Document Reviewed: 11/26/2012 Elsevier Interactive Patient Education  2017 Elsevier Inc.  

## 2018-08-15 NOTE — Progress Notes (Signed)
Pt is here for ROB. G2P0010 [redacted]w[redacted]d.

## 2018-08-16 ENCOUNTER — Encounter: Payer: Medicaid Other | Admitting: Obstetrics and Gynecology

## 2018-08-26 ENCOUNTER — Encounter (HOSPITAL_COMMUNITY): Payer: Self-pay

## 2018-09-04 ENCOUNTER — Ambulatory Visit (HOSPITAL_COMMUNITY)
Admission: RE | Admit: 2018-09-04 | Discharge: 2018-09-04 | Disposition: A | Discharge status: Home / Self Care | Payer: Medicaid Other | Source: Ambulatory Visit | Attending: Advanced Practice Midwife | Admitting: Advanced Practice Midwife

## 2018-09-04 DIAGNOSIS — Z363 Encounter for antenatal screening for malformations: Secondary | ICD-10-CM

## 2018-09-04 DIAGNOSIS — Z3A19 19 weeks gestation of pregnancy: Secondary | ICD-10-CM

## 2018-09-04 DIAGNOSIS — Z3402 Encounter for supervision of normal first pregnancy, second trimester: Secondary | ICD-10-CM | POA: Insufficient documentation

## 2018-09-06 ENCOUNTER — Other Ambulatory Visit (HOSPITAL_COMMUNITY): Payer: Medicaid Other

## 2018-09-09 ENCOUNTER — Emergency Department (HOSPITAL_COMMUNITY)
Admission: EM | Admit: 2018-09-09 | Discharge: 2018-09-09 | Discharge status: Against Medical Advice | Payer: Medicaid Other

## 2018-09-09 ENCOUNTER — Other Ambulatory Visit (HOSPITAL_COMMUNITY): Payer: Self-pay | Admitting: *Deleted

## 2018-09-09 DIAGNOSIS — Z362 Encounter for other antenatal screening follow-up: Secondary | ICD-10-CM

## 2018-09-09 NOTE — ED Notes (Signed)
Called for Pt No answer 

## 2018-09-09 NOTE — ED Notes (Signed)
Called Pt no answer.

## 2018-09-12 ENCOUNTER — Encounter: Payer: Self-pay | Admitting: Certified Nurse Midwife

## 2018-09-12 ENCOUNTER — Ambulatory Visit (INDEPENDENT_AMBULATORY_CARE_PROVIDER_SITE_OTHER): Payer: Medicaid Other | Admitting: Certified Nurse Midwife

## 2018-09-12 VITALS — BP 121/71 | HR 101 | Wt 148.8 lb

## 2018-09-12 DIAGNOSIS — Z3402 Encounter for supervision of normal first pregnancy, second trimester: Secondary | ICD-10-CM

## 2018-09-12 DIAGNOSIS — Z3400 Encounter for supervision of normal first pregnancy, unspecified trimester: Secondary | ICD-10-CM

## 2018-09-12 DIAGNOSIS — Z3A20 20 weeks gestation of pregnancy: Secondary | ICD-10-CM

## 2018-09-12 NOTE — Progress Notes (Signed)
   PRENATAL VISIT NOTE  Subjective:  Joanne Cordova is a 17 y.o. G2P0010 at 6081w1d being seen today for ongoing prenatal care.  She is currently monitored for the following issues for this low-risk pregnancy and has Supervision of normal pregnancy, antepartum on their problem list.  Patient reports no complaints.  Contractions: Not present. Vag. Bleeding: None.  Movement: Present. Denies leaking of fluid.   The following portions of the patient's history were reviewed and updated as appropriate: allergies, current medications, past family history, past medical history, past social history, past surgical history and problem list. Problem list updated.  Objective:   Vitals:   09/12/18 1020  BP: 121/71  Pulse: (!) 101  Weight: 148 lb 12.8 oz (67.5 kg)    Fetal Status:     Movement: Present     General:  Alert, oriented and cooperative. Patient is in no acute distress.  Skin: Skin is warm and dry. No rash noted.   Cardiovascular: Normal heart rate noted  Respiratory: Normal respiratory effort, no problems with respiration noted  Abdomen: Soft, gravid, appropriate for gestational age.  Pain/Pressure: Absent     Pelvic: Cervical exam deferred        Extremities: Normal range of motion.  Edema: None  Mental Status: Normal mood and affect. Normal behavior. Normal judgment and thought content.   Assessment and Plan:  Pregnancy: G2P0010 at 5381w1d  1. Supervision of normal first pregnancy, antepartum - Patient doing well, no complaints - Anticipatory guidance on upcoming appointments - AFP declined by patient in office today  - f/u US scheduled for 12/26 to complete anatomy   Preterm labor symptoms and general obstetric precautions including but not limited to vaginal bleeding, contractions, leaking of fluid and fetal movement were reviewed in detail with the patient. Please refer to After Visit Summary for other counseling recommendations.  Return in about 4 weeks (around 10/10/2018)  for ROB.  Future Appointments  Date Time Provider Department Center  10/03/2018  3:30 PM [facility] [place] 1 [facility]  10/10/2018 10:30 AM Marny LowensteinWenzel, Julie N, PA-C [facility] None    Sharyon CableVeronica C Thijs Brunton, CNM

## 2018-09-12 NOTE — Progress Notes (Signed)
Pt presents for a ROB. Pt has no other concerns.

## 2018-09-12 NOTE — Patient Instructions (Signed)
Second Trimester of Pregnancy The second trimester is from week 13 through week 28, month 4 through 6. This is often the time in pregnancy that you feel your best. Often times, morning sickness has lessened or quit. You may have more energy, and you may get hungry more often. Your unborn baby (fetus) is growing rapidly. At the end of the sixth month, he or she is about 9 inches long and weighs about 1 pounds. You will likely feel the baby move (quickening) between 18 and 20 weeks of pregnancy. Follow these instructions at home:  Avoid all smoking, herbs, and alcohol. Avoid drugs not approved by your doctor.  Do not use any tobacco products, including cigarettes, chewing tobacco, and electronic cigarettes. If you need help quitting, ask your doctor. You may get counseling or other support to help you quit.  Only take medicine as told by your doctor. Some medicines are safe and some are not during pregnancy.  Exercise only as told by your doctor. Stop exercising if you start having cramps.  Eat regular, healthy meals.  Wear a good support bra if your breasts are tender.  Do not use hot tubs, steam rooms, or saunas.  Wear your seat belt when driving.  Avoid raw meat, uncooked cheese, and liter boxes and soil used by cats.  Take your prenatal vitamins.  Take 1500-2000 milligrams of calcium daily starting at the 20th week of pregnancy until you deliver your baby.  Try taking medicine that helps you poop (stool softener) as needed, and if your doctor approves. Eat more fiber by eating fresh fruit, vegetables, and whole grains. Drink enough fluids to keep your pee (urine) clear or pale yellow.  Take warm water baths (sitz baths) to soothe pain or discomfort caused by hemorrhoids. Use hemorrhoid cream if your doctor approves.  If you have puffy, bulging veins (varicose veins), wear support hose. Raise (elevate) your feet for 15 minutes, 3-4 times a day. Limit salt in your diet.  Avoid heavy  lifting, wear low heals, and sit up straight.  Rest with your legs raised if you have leg cramps or low back pain.  Visit your dentist if you have not gone during your pregnancy. Use a soft toothbrush to brush your teeth. Be gentle when you floss.  You can have sex (intercourse) unless your doctor tells you not to.  Go to your doctor visits. Get help if:  You feel dizzy.  You have mild cramps or pressure in your lower belly (abdomen).  You have a nagging pain in your belly area.  You continue to feel sick to your stomach (nauseous), throw up (vomit), or have watery poop (diarrhea).  You have bad smelling fluid coming from your vagina.  You have pain with peeing (urination). Get help right away if:  You have a fever.  You are leaking fluid from your vagina.  You have spotting or bleeding from your vagina.  You have severe belly cramping or pain.  You lose or gain weight rapidly.  You have trouble catching your breath and have chest pain.  You notice sudden or extreme puffiness (swelling) of your face, hands, ankles, feet, or legs.  You have not felt the baby move in over an hour.  You have severe headaches that do not go away with medicine.  You have vision changes. This information is not intended to replace advice given to you by your health care provider. Make sure you discuss any questions you have with your health care   provider. Document Released: 12/20/2009 Document Revised: 03/02/2016 Document Reviewed: 11/26/2012 Elsevier Interactive Patient Education  2017 Elsevier Inc.  

## 2018-10-03 ENCOUNTER — Ambulatory Visit (HOSPITAL_COMMUNITY)
Admission: RE | Admit: 2018-10-03 | Discharge: 2018-10-03 | Disposition: A | Discharge status: Home / Self Care | Payer: Medicaid Other | Source: Ambulatory Visit | Attending: Certified Nurse Midwife | Admitting: Certified Nurse Midwife

## 2018-10-03 DIAGNOSIS — Z3A23 23 weeks gestation of pregnancy: Secondary | ICD-10-CM

## 2018-10-03 DIAGNOSIS — Z362 Encounter for other antenatal screening follow-up: Secondary | ICD-10-CM | POA: Diagnosis present

## 2018-10-09 NOTE — L&D Delivery Note (Signed)
Operative Delivery Note Joanne Cordova is a 18 y.o. female G2P0010 with IUP at [redacted]w[redacted]d admitted for IOL for GDMA1.  She progressed in labor until second stage, but had a prolonged second stage and maternal exhaustion. She was counseled about her options. She opted for vacuum assisted vaginal delivery.   Risks of vacuum assistance were discussed in detail, including but not limited to, bleeding, infection, damage to maternal tissues, fetal cephalohematoma, inability to effect vaginal delivery of the head or shoulder dystocia that cannot be resolved by established maneuvers and need for emergency cesarean section.  Patient gave verbal consent.  Patient was examined and found to be fully dilated with fetal station of +3. The soft vacuum soft cup was positioned over the sagittal suture 3 cm anterior to posterior fontanelle.  Pressure was then increased to 500 mmHg, and the patient was instructed to push.  Pulling was administered along the pelvic curve.  The pulls was administered during pushing with three contractions, there were two popoffs.  The infant was then delivered atraumatically, noted to be a viable female infant, Apgars of 7 and 9, weight was 7 pounds 15 ounces.  Neonatology team present for delivery.  There was spontaneous placental delivery, intact with three-vessel cord.  EBL 300 ml, epidural anesthesia.  Second degree perineal laceration noted, repaired in usual fashion with 2-0 Vicryl.  Sponge, instrument and needle counts were correct x2.  The patient and baby were stable after delivery and remained in couplet care.    Jaynie Collins, MD, FACOG Obstetrician & Gynecologist, [facility], [facility] 01/23/2019, 11:52 AM

## 2018-10-10 ENCOUNTER — Encounter: Payer: Self-pay | Admitting: Obstetrics

## 2018-10-10 ENCOUNTER — Encounter: Payer: Self-pay | Admitting: Medical

## 2018-10-10 ENCOUNTER — Ambulatory Visit (INDEPENDENT_AMBULATORY_CARE_PROVIDER_SITE_OTHER): Payer: Medicaid Other | Admitting: Medical

## 2018-10-10 VITALS — BP 125/72 | HR 94 | Wt 155.6 lb

## 2018-10-10 DIAGNOSIS — Z3402 Encounter for supervision of normal first pregnancy, second trimester: Secondary | ICD-10-CM

## 2018-10-10 DIAGNOSIS — K5901 Slow transit constipation: Secondary | ICD-10-CM

## 2018-10-10 DIAGNOSIS — Z3400 Encounter for supervision of normal first pregnancy, unspecified trimester: Secondary | ICD-10-CM

## 2018-10-10 MED ORDER — DOCUSATE SODIUM 250 MG PO CAPS: 250.0000 mg | ORAL_CAPSULE | Freq: Every day | ORAL | 0 refills | Status: DC

## 2018-10-10 MED ORDER — POLYETHYLENE GLYCOL 3350 17 GM/SCOOP PO POWD: 1.0000 | Freq: Once | ORAL | 0 refills | Status: AC

## 2018-10-10 MED ORDER — PRENATAL ADULT GUMMY/DHA/FA 0.4-25 MG PO CHEW: 2.0000 | CHEWABLE_TABLET | Freq: Every day | ORAL | 6 refills | Status: DC

## 2018-10-10 NOTE — Patient Instructions (Signed)

## 2018-10-10 NOTE — Progress Notes (Signed)
Pt presents for ROB c/o constipation. Pt also c/o prenatal vitamins causing N&V.

## 2018-10-10 NOTE — Progress Notes (Signed)
   PRENATAL VISIT NOTE  Subjective:  Joanne Cordova is a 18 y.o. G2P0010 at [redacted]w[redacted]d being seen today for ongoing prenatal care.  She is currently monitored for the following issues for this low-risk pregnancy and has Supervision of normal pregnancy, antepartum on their problem list.  Patient reports constipation and N/V from prenatal vitamins.  Contractions: Not present. Vag. Bleeding: None.  Movement: Present. Denies leaking of fluid.   The following portions of the patient's history were reviewed and updated as appropriate: allergies, current medications, past family history, past medical history, past social history, past surgical history and problem list. Problem list updated.  Objective:   Vitals:   10/10/18 1033  BP: 125/72  Pulse: 94  Weight: 155 lb 9.6 oz (70.6 kg)    Fetal Status: Fetal Heart Rate (bpm): 140 Fundal Height: 24 cm Movement: Present     General:  Alert, oriented and cooperative. Patient is in no acute distress.  Skin: Skin is warm and dry. No rash noted.   Cardiovascular: Normal heart rate noted  Respiratory: Normal respiratory effort, no problems with respiration noted  Abdomen: Soft, gravid, appropriate for gestational age.  Pain/Pressure: Absent     Pelvic: Cervical exam deferred        Extremities: Normal range of motion.  Edema: None  Mental Status: Normal mood and affect. Normal behavior. Normal judgment and thought content.   Assessment and Plan:  Pregnancy: G2P0010 at [redacted]w[redacted]d  1. Supervision of normal first pregnancy, antepartum - Prenatal MV & Min w/FA-DHA (PRENATAL ADULT GUMMY/DHA/FA) 0.4-25 MG CHEW; Chew 2 tablets by mouth daily.  Dispense: 30 tablet; Refill: 6  2. Slow transit constipation - Discussed increased PO hydration and regimen for managing constipation with medications below - docusate sodium (COLACE) 250 MG capsule; Take 1 capsule (250 mg total) by mouth daily.  Dispense: 10 capsule; Refill: 0 - polyethylene glycol powder  (GLYCOLAX/MIRALAX) powder; Take 255 g by mouth once for 1 dose.  Dispense: 255 g; Refill: 0  Preterm labor symptoms and general obstetric precautions including but not limited to vaginal bleeding, contractions, leaking of fluid and fetal movement were reviewed in detail with the patient. Please refer to After Visit Summary for other counseling recommendations.  Return in about 4 weeks (around 11/07/2018) for LOB, 28 week labs (fasting).    Vonzella Nipple, PA-C

## 2018-10-18 ENCOUNTER — Inpatient Hospital Stay (HOSPITAL_COMMUNITY)
Admission: AD | Admit: 2018-10-18 | Discharge: 2018-10-18 | Disposition: A | Discharge status: Home / Self Care | Payer: Medicaid Other | Attending: Obstetrics and Gynecology | Admitting: Obstetrics and Gynecology

## 2018-10-18 ENCOUNTER — Telehealth: Payer: Self-pay

## 2018-10-18 ENCOUNTER — Encounter (HOSPITAL_COMMUNITY): Payer: Self-pay | Admitting: *Deleted

## 2018-10-18 DIAGNOSIS — R1012 Left upper quadrant pain: Secondary | ICD-10-CM | POA: Insufficient documentation

## 2018-10-18 DIAGNOSIS — O26892 Other specified pregnancy related conditions, second trimester: Secondary | ICD-10-CM | POA: Diagnosis not present

## 2018-10-18 DIAGNOSIS — Z3A25 25 weeks gestation of pregnancy: Secondary | ICD-10-CM | POA: Diagnosis not present

## 2018-10-18 DIAGNOSIS — R0781 Pleurodynia: Secondary | ICD-10-CM | POA: Diagnosis not present

## 2018-10-18 DIAGNOSIS — Z3400 Encounter for supervision of normal first pregnancy, unspecified trimester: Secondary | ICD-10-CM

## 2018-10-18 DIAGNOSIS — M940 Chondrocostal junction syndrome [Tietze]: Secondary | ICD-10-CM

## 2018-10-18 LAB — URINALYSIS, ROUTINE W REFLEX MICROSCOPIC
Bilirubin Urine: NEGATIVE
Glucose, UA: NEGATIVE mg/dL
Hgb urine dipstick: NEGATIVE
KETONES UR: NEGATIVE mg/dL
Nitrite: NEGATIVE
PROTEIN: NEGATIVE mg/dL
Specific Gravity, Urine: 1.011 (ref 1.005–1.030)
pH: 7 (ref 5.0–8.0)

## 2018-10-18 MED ORDER — ACETAMINOPHEN 500 MG PO TABS: 1000.0000 mg | ORAL_TABLET | Freq: Four times a day (QID) | ORAL | 2 refills | Status: DC | PRN

## 2018-10-18 MED ORDER — ACETAMINOPHEN 500 MG PO TABS
1000.0000 mg | ORAL_TABLET | Freq: Once | ORAL | Status: AC
  Administered 2018-10-18: 1000 mg via ORAL
  Filled 2018-10-18: qty 2

## 2018-10-18 NOTE — Discharge Instructions (Signed)

## 2018-10-18 NOTE — Telephone Encounter (Signed)
Pt called stating that she is having sharp pains in her upper abdomen. Pt advised to go to MAU to be evaluated.

## 2018-10-18 NOTE — MAU Provider Note (Signed)
History    Chief Complaint  Patient presents with  . LUQ pain   Joanne Cordova is a G2P0010 [age] YOF at 6624w2d who presents with 3 days of left lower rib pain with movement, sneezing, and deep breaths. She denies any trauma, falls, or event associated with the onset of the pain. She denies cough, hemoptysis, sputum production, HA, palpitations, dizziness, N/V/D, fevers, night sweats, LE edema, or abdominal pain. She has otherwise felt well. She has not tried any OTC medications.       OB History    Gravida  2   Para      Term      Preterm      AB  1   Living        SAB  1   TAB      Ectopic      Multiple      Live Births              Past Medical History:  Diagnosis Date  . Hx of chlamydia infection 2018  . Hx of gonorrhea 2018    Past Surgical History:  Procedure Laterality Date  . NO PAST SURGERIES      Family History  Problem Relation Age of Onset  . Cervical cancer Mother   . Diabetes Maternal Grandmother     Social History   Tobacco Use  . Smoking status: Never Smoker  . Smokeless tobacco: Never Used  Substance Use Topics  . Alcohol use: Not Currently    Comment: socially  . Drug use: Not Currently    Types: Marijuana    Allergies: No Known Allergies  Medications Prior to Admission  Medication Sig Dispense Refill Last Dose  . docusate sodium (COLACE) 250 MG capsule Take 1 capsule (250 mg total) by mouth daily. 10 capsule 0   . Prenat w/o A Vit-FeFum-FePo-FA (CONCEPT OB) 130-92.4-1 MG CAPS Take 1 tablet by mouth daily. (Patient not taking: Reported on 07/19/2018) 30 capsule 12 Not Taking  . Prenatal MV & Min w/FA-DHA (PRENATAL ADULT GUMMY/DHA/FA) 0.4-25 MG CHEW Chew 2 tablets by mouth daily. 30 tablet 6     Review of Systems  Constitutional: Negative.   HENT: Negative.   Eyes: Negative.   Respiratory: Negative for cough, hemoptysis, sputum production, shortness of breath and wheezing.        Left rib pain with deep breaths   Cardiovascular: Negative.   Gastrointestinal: Negative.   Genitourinary: Negative for dysuria.  Skin: Negative.   Neurological: Negative.    Physical Exam Blood pressure (!) 112/58, pulse 97, temperature 98.1 F (36.7 C), temperature source Oral, resp. rate 16, height 5\' 5"  (1.651 m), weight 72.6 kg, last menstrual period 04/23/2018, SpO2 100 %, unknown if currently breastfeeding. Physical Exam  Constitutional: She is oriented to person, place, and time. She appears well-nourished.  Cardiovascular: Normal rate, regular rhythm and normal heart sounds.  Respiratory: Effort normal and breath sounds normal. No respiratory distress.  Tenderness to left lower ribs under breast  GI: There is no abdominal tenderness. There is no guarding.  Musculoskeletal:        General: No edema.  Neurological: She is oriented to person, place, and time.  Skin: Skin is warm and dry. No rash noted. No erythema.  Psychiatric: She has a normal mood and affect.  Fetal HR tracing: Moderate variability, acels, no decels  MAU Course Procedures  MDM Assessment and Plan - Exam reassuring with reproducible left lower rib pain. Presentation consistent  with MSK pain/chosochondritis. Counseled patient to take Tylenol, return if the nature of the pain changes or worsens, and follow up at next [facility] appointment.

## 2018-10-18 NOTE — MAU Note (Signed)
Pt is having LUQ pain under her breast/rib cage for the last few days. Was waiting to see if it went away but hasn't. No bleeding or LOF, +FM.

## 2018-10-18 NOTE — MAU Provider Note (Signed)
History     CSN: [id number]  Arrival date and time: 10/18/18 1033   None     Chief Complaint  Patient presents with  . LUQ pain   Joanne Cordova is a 18 y.o. G1P0 at [redacted]w[redacted]d who presents for LUQ pain.      OB History    Gravida  2   Para      Term      Preterm      AB  1   Living        SAB  1   TAB      Ectopic      Multiple      Live Births              Past Medical History:  Diagnosis Date  . Hx of chlamydia infection 2018  . Hx of gonorrhea 2018    Past Surgical History:  Procedure Laterality Date  . NO PAST SURGERIES      Family History  Problem Relation Age of Onset  . Cervical cancer Mother   . Diabetes Maternal Grandmother     Social History   Tobacco Use  . Smoking status: Never Smoker  . Smokeless tobacco: Never Used  Substance Use Topics  . Alcohol use: Not Currently    Comment: socially  . Drug use: Not Currently    Types: Marijuana    Allergies: No Known Allergies  Medications Prior to Admission  Medication Sig Dispense Refill Last Dose  . docusate sodium (COLACE) 250 MG capsule Take 1 capsule (250 mg total) by mouth daily. 10 capsule 0   . Prenat w/o A Vit-FeFum-FePo-FA (CONCEPT OB) 130-92.4-1 MG CAPS Take 1 tablet by mouth daily. (Patient not taking: Reported on 07/19/2018) 30 capsule 12 Not Taking  . Prenatal MV & Min w/FA-DHA (PRENATAL ADULT GUMMY/DHA/FA) 0.4-25 MG CHEW Chew 2 tablets by mouth daily. 30 tablet 6     Review of Systems  Constitutional: Negative for chills and fever.  Respiratory: Negative for shortness of breath.   Gastrointestinal: Positive for abdominal pain (LUQ). Negative for constipation, diarrhea, nausea and vomiting.  Genitourinary: Negative for pelvic pain, vaginal bleeding, vaginal discharge and vaginal pain.  Neurological: Negative for dizziness, light-headedness and headaches.   Physical Exam   Blood pressure (!) 112/58, pulse 97, temperature 98.1 F (36.7 C), temperature source  Oral, resp. rate 16, height 5\' 5"  (1.651 m), weight 72.6 kg, last menstrual period 04/23/2018, SpO2 100 %, unknown if currently breastfeeding.  Physical Exam  Constitutional: She is oriented to person, place, and time. She appears well-developed and well-nourished. No distress.  HENT:  Head: Normocephalic and atraumatic.  Eyes: Conjunctivae are normal.  Neck: Normal range of motion.  Cardiovascular: Normal rate and regular rhythm.  Respiratory: Effort normal and breath sounds normal.  GI: Soft. Bowel sounds are normal. She exhibits no mass. Distention: Gravid. There is abdominal tenderness (LUQ with Palpation). There is guarding. There is no rebound.  Genitourinary:    Genitourinary Comments: Deferred   Musculoskeletal: Normal range of motion.  Neurological: She is alert and oriented to person, place, and time.  Skin: Skin is warm and dry.  Psychiatric: She has a normal mood and affect. Her behavior is normal.   Fetal Assessment: 140 bpm, Mod Var, -Decels, +Accels UC: None graphed or palpated MAU Course  Procedures Results for orders placed or performed during the hospital encounter of 10/18/18 (from the past 24 hour(s))  Urinalysis, Routine w reflex microscopic  Status: Abnormal   Collection Time: 10/18/18 11:07 AM  Result Value Ref Range   Color, Urine YELLOW YELLOW   APPearance CLEAR CLEAR   Specific Gravity, Urine 1.011 1.005 - 1.030   pH 7.0 5.0 - 8.0   Glucose, UA NEGATIVE NEGATIVE mg/dL   Hgb urine dipstick NEGATIVE NEGATIVE   Bilirubin Urine NEGATIVE NEGATIVE   Ketones, ur NEGATIVE NEGATIVE mg/dL   Protein, ur NEGATIVE NEGATIVE mg/dL   Nitrite NEGATIVE NEGATIVE   Leukocytes, UA SMALL (A) NEGATIVE   RBC / HPF 0-5 0 - 5 RBC/hpf   WBC, UA 0-5 0 - 5 WBC/hpf   Bacteria, UA MANY (A) NONE SEEN   Squamous Epithelial / LPF 0-5 0 - 5   Mucus PRESENT     MDM Exam Tylenol EFM  Assessment and Plan  IUP at 25.2wks Cat I FT Costochondritis   -Discussed physical exam  findings and diagnosis -Recommendation for management of pain with tylenol -NST Reactive; Can discontinue EFM -Will reassess   Follow Up (12:21 PM) -Patient reports improvement in symptoms with tylenol dosing. -Discussed non-pharmacological management of symptoms to include; brassiere changes, heat applications, and light stretches. -Request and given Rx for Tylenol 500mg  Take 2 tablets Q6-8 hrs prn, Disp 30, RF #2; Sent to pharmacy on file. -Request and given work excuse. -Keep appt as scheduled: Jan 30; [facility] -Encouraged to call or return to MAU if symptoms worsen or with the onset of new symptoms. -Discharged to home in improved condition  Cherre Robins MSN, CNM 10/18/2018, 11:38 AM

## 2018-10-29 ENCOUNTER — Inpatient Hospital Stay (HOSPITAL_COMMUNITY)
Admission: AD | Admit: 2018-10-29 | Discharge: 2018-10-29 | Disposition: A | Discharge status: Home / Self Care | Payer: Medicaid Other | Attending: Obstetrics and Gynecology | Admitting: Obstetrics and Gynecology

## 2018-10-29 ENCOUNTER — Other Ambulatory Visit: Payer: Self-pay

## 2018-10-29 ENCOUNTER — Encounter (HOSPITAL_COMMUNITY): Payer: Self-pay

## 2018-10-29 DIAGNOSIS — R0981 Nasal congestion: Secondary | ICD-10-CM | POA: Diagnosis present

## 2018-10-29 DIAGNOSIS — R05 Cough: Secondary | ICD-10-CM | POA: Insufficient documentation

## 2018-10-29 DIAGNOSIS — Z3A26 26 weeks gestation of pregnancy: Secondary | ICD-10-CM | POA: Diagnosis not present

## 2018-10-29 DIAGNOSIS — R030 Elevated blood-pressure reading, without diagnosis of hypertension: Secondary | ICD-10-CM | POA: Diagnosis not present

## 2018-10-29 DIAGNOSIS — R69 Illness, unspecified: Secondary | ICD-10-CM

## 2018-10-29 DIAGNOSIS — O26892 Other specified pregnancy related conditions, second trimester: Secondary | ICD-10-CM | POA: Diagnosis not present

## 2018-10-29 DIAGNOSIS — R509 Fever, unspecified: Secondary | ICD-10-CM | POA: Diagnosis not present

## 2018-10-29 DIAGNOSIS — J111 Influenza due to unidentified influenza virus with other respiratory manifestations: Secondary | ICD-10-CM

## 2018-10-29 LAB — URINALYSIS, ROUTINE W REFLEX MICROSCOPIC
Bilirubin Urine: NEGATIVE
Glucose, UA: NEGATIVE mg/dL
Hgb urine dipstick: NEGATIVE
KETONES UR: NEGATIVE mg/dL
Nitrite: NEGATIVE
PH: 7 (ref 5.0–8.0)
PROTEIN: NEGATIVE mg/dL
Specific Gravity, Urine: 1.01 (ref 1.005–1.030)

## 2018-10-29 LAB — COMPREHENSIVE METABOLIC PANEL
ALBUMIN: 2.9 g/dL — AB (ref 3.5–5.0)
ALT: 11 U/L (ref 0–44)
ANION GAP: 7 (ref 5–15)
AST: 19 U/L (ref 15–41)
Alkaline Phosphatase: 108 U/L (ref 38–126)
BUN: <5 mg/dL — ABNORMAL LOW (ref 6–20)
CO2: 22 mmol/L (ref 22–32)
Calcium: 8 mg/dL — ABNORMAL LOW (ref 8.9–10.3)
Chloride: 104 mmol/L (ref 98–111)
Creatinine, Ser: 0.4 mg/dL — ABNORMAL LOW (ref 0.44–1.00)
GFR calc Af Amer: >60 mL/min (ref >60)
GFR calc non Af Amer: >60 mL/min (ref >60)
GLUCOSE: 79 mg/dL (ref 70–99)
POTASSIUM: 3.2 mmol/L — AB (ref 3.5–5.1)
SODIUM: 133 mmol/L — AB (ref 135–145)
Total Bilirubin: 0.7 mg/dL (ref 0.3–1.2)
Total Protein: 6.4 g/dL — ABNORMAL LOW (ref 6.5–8.1)

## 2018-10-29 LAB — CBC
HCT: 31.4 % — ABNORMAL LOW (ref 36.0–46.0)
Hemoglobin: 10.6 g/dL — ABNORMAL LOW (ref 12.0–15.0)
MCH: 30.2 pg (ref 26.0–34.0)
MCHC: 33.8 g/dL (ref 30.0–36.0)
MCV: 89.5 fL (ref 80.0–100.0)
Platelets: 158 10*3/uL (ref 150–400)
RBC: 3.51 MIL/uL — ABNORMAL LOW (ref 3.87–5.11)
RDW: 12.8 % (ref 11.5–15.5)
WBC: 9.2 10*3/uL (ref 4.0–10.5)
nRBC: 0 % (ref 0.0–0.2)

## 2018-10-29 LAB — PROTEIN / CREATININE RATIO, URINE
Creatinine, Urine: 67 mg/dL
Protein Creatinine Ratio: 0.18 mg/mg{Cre} — ABNORMAL HIGH (ref 0.00–0.15)
Total Protein, Urine: 12 mg/dL

## 2018-10-29 LAB — INFLUENZA PANEL BY PCR (TYPE A & B)
Influenza A By PCR: NEGATIVE
Influenza B By PCR: NEGATIVE

## 2018-10-29 NOTE — MAU Provider Note (Signed)
History   Chief Complaint  Patient presents with  . Fever  . Back Pain  . Decreased Fetal Movement  . Nasal Congestion  . Cough  . Generalized Body Aches  . Otalgia   HPI Joanne Cordova is a 18 yo G2P0010 at [redacted]w[redacted]d presenting here today accompanied by her boyfriend with congestion, myalgias, low back pain, cough (productive with mucus), post-tussive emesis, fever (101 last night, 98 this morning), right ear pain, rhinorrhea, and eye pain. Patient reports that these sympoms began yesterday morning and worsened today. Denies contact with the flu but reports that her boyfriend had a cold a few days ago, however his symptoms were different. Patient is not up to date on flu vaccine.  Denies abdominal pain, diarrhea, dizziness, lightheadedness, and urinary symptoms. Reports +FM without LOF, vaginal bleeding or discharge.  OB History    Gravida  2   Para      Term      Preterm      AB  1   Living        SAB  1   TAB      Ectopic      Multiple      Live Births              Past Medical History:  Diagnosis Date  . Hx of chlamydia infection 2018  . Hx of gonorrhea 2018    Past Surgical History:  Procedure Laterality Date  . NO PAST SURGERIES      Family History  Problem Relation Age of Onset  . Cervical cancer Mother   . Diabetes Maternal Grandmother     Social History   Tobacco Use  . Smoking status: Never Smoker  . Smokeless tobacco: Never Used  Substance Use Topics  . Alcohol use: Not Currently    Comment: socially  . Drug use: Not Currently    Types: Marijuana    Allergies: No Known Allergies  Medications Prior to Admission  Medication Sig Dispense Refill Last Dose  . acetaminophen (TYLENOL) 500 MG tablet Take 2 tablets (1,000 mg total) by mouth every 6 (six) hours as needed for moderate pain. 30 tablet 2   . docusate sodium (COLACE) 250 MG capsule Take 1 capsule (250 mg total) by mouth daily. 10 capsule 0   . Prenat w/o A Vit-FeFum-FePo-FA  (CONCEPT OB) 130-92.4-1 MG CAPS Take 1 tablet by mouth daily. (Patient not taking: Reported on 07/19/2018) 30 capsule 12 Not Taking  . Prenatal MV & Min w/FA-DHA (PRENATAL ADULT GUMMY/DHA/FA) 0.4-25 MG CHEW Chew 2 tablets by mouth daily. 30 tablet 6     Review of Systems  Constitutional: Positive for chills, fever and malaise/fatigue.  HENT: Positive for congestion, ear pain (Right) and sinus pain. Negative for ear discharge and hearing loss.   Eyes: Positive for pain (bilateral). Negative for discharge and redness.  Respiratory: Positive for cough and sputum production (mucus). Negative for hemoptysis, shortness of breath and wheezing.   Cardiovascular: Positive for chest pain (with coughing).  Gastrointestinal: Positive for nausea and vomiting (post-tussive). Negative for abdominal pain, constipation and diarrhea.  Genitourinary: Negative for dysuria.  Musculoskeletal: Positive for back pain (low back) and myalgias.  Neurological: Negative for dizziness and headaches.   Physical Exam Blood pressure 140/75, pulse (!) 122, temperature 98.9 F (37.2 C), temperature source Oral, resp. rate 20, weight 71.7 kg, last menstrual period 04/23/2018, SpO2 100 %, unknown if currently breastfeeding. Physical Exam  Constitutional: She is oriented to person, place, and  time. She appears well-developed and well-nourished.  HENT:  Nose: Mucosal edema and rhinorrhea present.  Mouth/Throat: Mucous membranes are not dry. Posterior oropharyngeal edema present. No oropharyngeal exudate or posterior oropharyngeal erythema.  EAC without drainage bilaterally. Bilateral middle ear effusion, no erythema. Right TM buldging   Eyes: Conjunctivae are normal.  Cardiovascular: Normal rate and regular rhythm. Exam reveals no gallop and no friction rub.  No murmur heard. Respiratory: Effort normal and breath sounds normal. No respiratory distress. She has no wheezes. She has no rales.  Neurological: She is alert and  oriented to person, place, and time.  Skin: No rash noted.    MAU Course Elevated BP reading upon MAU arrival (140/75). Repeat BPs WNL. Pre-e labs ordered.  CBC: Hgb: 10.6, HCT: 31.4. Plt WNL.  CMP: Liver enzymes WNL  UPC: 0.18 (mildly elevated) Influenza PCR ordered- negative UA ordered to r/o UTI- Positive for leukocytes, many bacteria, and mucus. Sent for culture to r/o UTI.   MDM Joanne Cordova is a 18 yo G2P0010 at [redacted]w[redacted]d presenting with flu-like symptoms that began yesterday. Elevated BP noted upon MAU arrival.  Flu-like symptoms:  Take tylenol PRN for pain and fever  Start tamiflu pending positive flu results Elevated BP:  3 repeat blood pressures were WNL.   Pre-e labs: Pre-e labs reassuring   Suspected be an isolated elevated BP d/t patient's acute illness.  Copper Basnett, PA-S  10/29/18

## 2018-10-29 NOTE — MAU Note (Signed)
(  mask on at registration). Very congested.  Had a fever of 101 last night.  Having back pain.  Ears hurt.  Has been coughing a lot. Has a really stuffy move.  Baby normally moves a lot, and hasn't been feeling as much today. Body aches

## 2018-10-29 NOTE — Discharge Instructions (Signed)
Safe Medications in Pregnancy  ° °Acne: °Benzoyl Peroxide °Salicylic Acid ° °Backache/Headache: °Tylenol: 2 regular strength every 4 hours OR °             2 Extra strength every 6 hours ° °Colds/Coughs/Allergies: °Benadryl (alcohol free) 25 mg every 6 hours as needed °Breath right strips °Claritin °Cepacol throat lozenges °Chloraseptic throat spray °Cold-Eeze- up to three times per day °Cough drops, alcohol free °Flonase (by prescription only) °Guaifenesin °Mucinex °Robitussin DM (plain only, alcohol free) °Saline nasal spray/drops °Sudafed (pseudoephedrine) & Actifed ** use only after [redacted] weeks gestation and if you do not have high blood pressure °Tylenol °Vicks Vaporub °Zinc lozenges °Zyrtec  ° °Constipation: °Colace °Ducolax suppositories °Fleet enema °Glycerin suppositories °Metamucil °Milk of magnesia °Miralax °Senokot °Smooth move tea ° °Diarrhea: °Kaopectate °Imodium A-D ° °*NO pepto Bismol ° °Hemorrhoids: °Anusol °Anusol HC °Preparation H °Tucks ° °Indigestion: °Tums °Maalox °Mylanta °Zantac  °Pepcid ° °Insomnia: °Benadryl (alcohol free) 25mg every 6 hours as needed °Tylenol PM °Unisom, no Gelcaps ° °Leg Cramps: °Tums °MagGel ° °Nausea/Vomiting:  °Bonine °Dramamine °Emetrol °Ginger extract °Sea bands °Meclizine  °Nausea medication to take during pregnancy:  °Unisom (doxylamine succinate 25 mg tablets) Take one tablet daily at bedtime. If symptoms are not adequately controlled, the dose can be increased to a maximum recommended dose of two tablets daily (1/2 tablet in the morning, 1/2 tablet mid-afternoon and one at bedtime). °Vitamin B6 100mg tablets. Take one tablet twice a day (up to 200 mg per day). ° °Skin Rashes: °Aveeno products °Benadryl cream or 25mg every 6 hours as needed °Calamine Lotion °1% cortisone cream ° °Yeast infection: °Gyne-lotrimin 7 °Monistat 7 ° °Gum/tooth pain: °Anbesol ° °**If taking multiple medications, please check labels to avoid duplicating the same active ingredients °**take  medication as directed on the label °** Do not exceed 4000 mg of tylenol in 24 hours °**Do not take medications that contain aspirin or ibuprofen ° ° ° ° °Influenza, Adult °Influenza, more commonly known as "the flu," is a viral infection that mainly affects the respiratory tract. The respiratory tract includes organs that help you breathe, such as the lungs, nose, and throat. The flu causes many symptoms similar to the common cold along with high fever and body aches. °The flu spreads easily from person to person (is contagious). Getting a flu shot (influenza vaccination) every year is the best way to prevent the flu. °What are the causes? °This condition is caused by the influenza virus. You can get the virus by: °· Breathing in droplets that are in the air from an infected person's cough or sneeze. °· Touching something that has been exposed to the virus (has been contaminated) and then touching your mouth, nose, or eyes. °What increases the risk? °The following factors may make you more likely to get the flu: °· Not washing or sanitizing your hands often. °· Having close contact with many people during cold and flu season. °· Touching your mouth, eyes, or nose without first washing or sanitizing your hands. °· Not getting a yearly (annual) flu shot. °You may have a higher risk for the flu, including serious problems such as a lung infection (pneumonia), if you: °· Are older than 65. °· Are pregnant. °· Have a weakened disease-fighting system (immune system). You may have a weakened immune system if you: °? Have HIV or AIDS. °? Are undergoing chemotherapy. °? Are taking medicines that reduce (suppress) the activity of your immune system. °· Have a long-term (chronic) illness, such   as heart disease, kidney disease, diabetes, or lung disease. °· Have a liver disorder. °· Are severely overweight (morbidly obese). °· Have anemia. This is a condition that affects your red blood cells. °· Have asthma. °What are the  signs or symptoms? °Symptoms of this condition usually begin suddenly and last 4-14 days. They may include: °· Fever and chills. °· Headaches, body aches, or muscle aches. °· Sore throat. °· Cough. °· Runny or stuffy (congested) nose. °· Chest discomfort. °· Poor appetite. °· Weakness or fatigue. °· Dizziness. °· Nausea or vomiting. °How is this diagnosed? °This condition may be diagnosed based on: °· Your symptoms and medical history. °· A physical exam. °· Swabbing your nose or throat and testing the fluid for the influenza virus. °How is this treated? °If the flu is diagnosed early, you can be treated with medicine that can help reduce how severe the illness is and how long it lasts (antiviral medicine). This may be given by mouth (orally) or through an IV. °Taking care of yourself at home can help relieve symptoms. Your health care provider may recommend: °· Taking over-the-counter medicines. °· Drinking plenty of fluids. °In many cases, the flu goes away on its own. If you have severe symptoms or complications, you may be treated in a hospital. °Follow these instructions at home: °Activity °· Rest as needed and get plenty of sleep. °· Stay home from work or school as told by your health care provider. Unless you are visiting your health care provider, avoid leaving home until your fever has been gone for 24 hours without taking medicine. °Eating and drinking °· Take an oral rehydration solution (ORS). This is a drink that is sold at pharmacies and retail stores. °· Drink enough fluid to keep your urine pale yellow. °· Drink clear fluids in small amounts as you are able. Clear fluids include water, ice chips, diluted fruit juice, and low-calorie sports drinks. °· Eat bland, easy-to-digest foods in small amounts as you are able. These foods include bananas, applesauce, rice, lean meats, toast, and crackers. °· Avoid drinking fluids that contain a lot of sugar or caffeine, such as energy drinks, regular sports  drinks, and soda. °· Avoid alcohol. °· Avoid spicy or fatty foods. °General instructions ° °  ° °· Take over-the-counter and prescription medicines only as told by your health care provider. °· Use a cool mist humidifier to add humidity to the air in your home. This can make it easier to breathe. °· Cover your mouth and nose when you cough or sneeze. °· Wash your hands with soap and water often, especially after you cough or sneeze. If soap and water are not available, use alcohol-based hand sanitizer. °· Keep all follow-up visits as told by your health care provider. This is important. °How is this prevented? ° °· Get an annual flu shot. You may get the flu shot in late summer, fall, or winter. Ask your health care provider when you should get your flu shot. °· Avoid contact with people who are sick during cold and flu season. This is generally fall and winter. °Contact a health care provider if: °· You develop new symptoms. °· You have: °? Chest pain. °? Diarrhea. °? A fever. °· Your cough gets worse. °· You produce more mucus. °· You feel nauseous or you vomit. °Get help right away if: °· You develop shortness of breath or difficulty breathing. °· Your skin or nails turn a bluish color. °· You have severe pain or   stiffness in your neck. °· You develop a sudden headache or sudden pain in your face or ear. °· You cannot eat or drink without vomiting. °Summary °· Influenza, more commonly known as "the flu," is a viral infection that primarily affects your respiratory tract. °· Symptoms of the flu usually begin suddenly and last 4-14 days. °· Getting an annual flu shot is the best way to prevent getting the flu. °· Stay home from work or school as told by your health care provider. Unless you are visiting your health care provider, avoid leaving home until your fever has been gone for 24 hours without taking medicine. °· Keep all follow-up visits as told by your health care provider. This is important. °This  information is not intended to replace advice given to you by your health care provider. Make sure you discuss any questions you have with your health care provider. °Document Released: 09/22/2000 Document Revised: 03/13/2018 Document Reviewed: 03/13/2018 °Elsevier Interactive Patient Education © 2019 [employer] ° °

## 2018-10-29 NOTE — MAU Provider Note (Signed)
Chief Complaint:  Fever; Back Pain; Decreased Fetal Movement; Nasal Congestion; Cough; Generalized Body Aches; and Otalgia   First Provider Initiated Contact with Patient 10/29/18 1457     HPI: Joanne Cordova is a 18 y.o. G2P0010 at [redacted]w[redacted]d who presents to maternity admissions reporting multiple complaints. States she started feeling sick yesterday. Had fever last night of 101. Her symptoms include, cough, congestion, bilateral ear pain, & body aches. Denies sick contacts. Also reports decreased fetal movement today.  Denies contractions, leakage of fluid or vaginal bleeding.   Location: ear, head, body aches Quality: aching Severity: 8/10 in pain scale Duration: 1 day Timing: constant Modifying factors: nothing makes worse. Hasn't treated symptoms Associated signs and symptoms: fever  Past Medical History:  Diagnosis Date  . Hx of chlamydia infection 2018  . Hx of gonorrhea 2018   OB History  Gravida Para Term Preterm AB Living  2       1    SAB TAB Ectopic Multiple Live Births  1            # Outcome Date GA Lbr Len/2nd Weight Sex Delivery Anes PTL Lv  2 Current           1 SAB 2018           Past Surgical History:  Procedure Laterality Date  . NO PAST SURGERIES     Family History  Problem Relation Age of Onset  . Cervical cancer Mother   . Diabetes Maternal Grandmother    Social History   Tobacco Use  . Smoking status: Never Smoker  . Smokeless tobacco: Never Used  Substance Use Topics  . Alcohol use: Not Currently    Comment: socially  . Drug use: Not Currently    Types: Marijuana   No Known Allergies No medications prior to admission.    I have reviewed patient's Past Medical Hx, Surgical Hx, Family Hx, Social Hx, medications and allergies.   ROS:  Review of Systems  Constitutional: Positive for chills and fever.  HENT: Positive for congestion, ear pain and rhinorrhea. Negative for sinus pressure, sneezing and sore throat.   Respiratory: Positive for  cough. Negative for shortness of breath.   Cardiovascular: Negative for chest pain.  Gastrointestinal: Negative.   Genitourinary: Negative.   Musculoskeletal: Positive for myalgias.  Neurological: Positive for headaches.    Physical Exam   Patient Vitals for the past 24 hrs:  BP Temp Temp src Pulse Resp SpO2 Weight  10/29/18 1546 121/67 - - (!) 107 - - -  10/29/18 1531 (!) 118/50 - - (!) 115 - - -  10/29/18 1526 (!) 114/59 - - (!) 124 - - -  10/29/18 1425 140/75 98.9 F (37.2 C) Oral (!) 122 20 100 % 71.7 kg  10/29/18 1422 - - - - - 100 % -    Constitutional: Well-developed, well-nourished female in no acute distress.  Cardiovascular: normal rate & rhythm, no murmur Respiratory: normal effort, lung sounds clear throughout GI: Abd soft, non-tender, gravid appropriate for gestational age. Pos BS x 4 MS: Extremities nontender, no edema, normal ROM Neurologic: Alert and oriented x 4.  HENT: TM normal bilaterally. No exudate or erythema in oropharynx.   NST:  Baseline: 150 bpm, Variability: Good {> 6 bpm), Accelerations: Reactive and Decelerations: Absent   Labs: Results for orders placed or performed during the hospital encounter of 10/29/18 (from the past 24 hour(s))  Urinalysis, Routine w reflex microscopic     Status: Abnormal  Collection Time: 10/29/18  2:36 PM  Result Value Ref Range   Color, Urine YELLOW YELLOW   APPearance HAZY (A) CLEAR   Specific Gravity, Urine 1.010 1.005 - 1.030   pH 7.0 5.0 - 8.0   Glucose, UA NEGATIVE NEGATIVE mg/dL   Hgb urine dipstick NEGATIVE NEGATIVE   Bilirubin Urine NEGATIVE NEGATIVE   Ketones, ur NEGATIVE NEGATIVE mg/dL   Protein, ur NEGATIVE NEGATIVE mg/dL   Nitrite NEGATIVE NEGATIVE   Leukocytes, UA SMALL (A) NEGATIVE   RBC / HPF 0-5 0 - 5 RBC/hpf   WBC, UA 6-10 0 - 5 WBC/hpf   Bacteria, UA MANY (A) NONE SEEN   Squamous Epithelial / LPF 6-10 0 - 5   Mucus PRESENT   Protein / creatinine ratio, urine     Status: Abnormal    Collection Time: 10/29/18  2:36 PM  Result Value Ref Range   Creatinine, Urine 67.00 mg/dL   Total Protein, Urine 12 mg/dL   Protein Creatinine Ratio 0.18 (H) 0.00 - 0.15 mg/mg[Cre]  Influenza panel by PCR (type A & B)     Status: None   Collection Time: 10/29/18  2:58 PM  Result Value Ref Range   Influenza A By PCR NEGATIVE NEGATIVE   Influenza B By PCR NEGATIVE NEGATIVE  CBC     Status: Abnormal   Collection Time: 10/29/18  3:14 PM  Result Value Ref Range   WBC 9.2 4.0 - 10.5 K/uL   RBC 3.51 (L) 3.87 - 5.11 MIL/uL   Hemoglobin 10.6 (L) 12.0 - 15.0 g/dL   HCT 31.1 (L) 21.6 - 24.4 %   MCV 89.5 80.0 - 100.0 fL   MCH 30.2 26.0 - 34.0 pg   MCHC 33.8 30.0 - 36.0 g/dL   RDW 69.5 07.2 - 25.7 %   Platelets 158 150 - 400 K/uL   nRBC 0.0 0.0 - 0.2 %  Comprehensive metabolic panel     Status: Abnormal   Collection Time: 10/29/18  3:14 PM  Result Value Ref Range   Sodium 133 (L) 135 - 145 mmol/L   Potassium 3.2 (L) 3.5 - 5.1 mmol/L   Chloride 104 98 - 111 mmol/L   CO2 22 22 - 32 mmol/L   Glucose, Bld 79 70 - 99 mg/dL   BUN <5 (L) 6 - 20 mg/dL   Creatinine, Ser 5.05 (L) 0.44 - 1.00 mg/dL   Calcium 8.0 (L) 8.9 - 10.3 mg/dL   Total Protein 6.4 (L) 6.5 - 8.1 g/dL   Albumin 2.9 (L) 3.5 - 5.0 g/dL   AST 19 15 - 41 U/L   ALT 11 0 - 44 U/L   Alkaline Phosphatase 108 38 - 126 U/L   Total Bilirubin 0.7 0.3 - 1.2 mg/dL   GFR calc non Af Amer >60 >60 mL/min   GFR calc Af Amer >60 >60 mL/min   Anion gap 7 5 - 15    Imaging:  No results found.  MAU Course: Orders Placed This Encounter  Procedures  . Culture, OB Urine  . Urinalysis, Routine w reflex microscopic  . Influenza panel by PCR (type A & B)  . CBC  . Comprehensive metabolic panel  . Protein / creatinine ratio, urine  . Droplet Isolation  . Discharge patient   No orders of the defined types were placed in this encounter.   MDM: Fetal tracing appropriate for gestation & fetal movement noted on monitor.  Pt afebrile here.  Flu swab pending  Triage BP elevated.  All repeats WNL. Pt denies hx of hypertension. Baseline labs collected  Will send home with info for symptomatic treatment & OTC meds safe in pregnancy list. Flu swab pending, if positive will send in tamiflu. Work note given, pt understands no work until 24 hrs s/p fever.   Assessment: 1. Influenza-like illness   2. Elevated BP without diagnosis of hypertension   3. [redacted] weeks gestation of pregnancy     Plan: Discharge home in stable condition.  Preterm Labor precautions and fetal kick counts   Allergies as of 10/29/2018   No Known Allergies     Medication List    STOP taking these medications   CONCEPT OB 130-92.4-1 MG Caps     TAKE these medications   acetaminophen 500 MG tablet Commonly known as:  TYLENOL Take 2 tablets (1,000 mg total) by mouth every 6 (six) hours as needed for moderate pain.   docusate sodium 250 MG capsule Commonly known as:  COLACE Take 1 capsule (250 mg total) by mouth daily.   Prenatal Adult Gummy/DHA/FA 0.4-25 MG Chew Chew 2 tablets by mouth daily.       Judeth HornLawrence, Johan Creveling, NP 10/29/2018 7:33 PM

## 2018-10-31 LAB — CULTURE, OB URINE

## 2018-11-07 ENCOUNTER — Other Ambulatory Visit: Payer: Medicaid Other

## 2018-11-07 ENCOUNTER — Encounter: Payer: Self-pay | Admitting: Obstetrics and Gynecology

## 2018-11-07 ENCOUNTER — Ambulatory Visit (INDEPENDENT_AMBULATORY_CARE_PROVIDER_SITE_OTHER): Payer: Medicaid Other | Admitting: Obstetrics and Gynecology

## 2018-11-07 ENCOUNTER — Encounter: Payer: Self-pay | Admitting: Obstetrics

## 2018-11-07 DIAGNOSIS — J069 Acute upper respiratory infection, unspecified: Secondary | ICD-10-CM

## 2018-11-07 DIAGNOSIS — Z3403 Encounter for supervision of normal first pregnancy, third trimester: Secondary | ICD-10-CM

## 2018-11-07 DIAGNOSIS — Z3400 Encounter for supervision of normal first pregnancy, unspecified trimester: Secondary | ICD-10-CM

## 2018-11-07 MED ORDER — AZITHROMYCIN 250 MG PO TABS: ORAL_TABLET | ORAL | 1 refills | Status: DC

## 2018-11-07 NOTE — Progress Notes (Signed)
Pt presents for ROB and 2 gtt labs.  Pt c/o sore throat and coughing yellow sputum  x 1 week.

## 2018-11-07 NOTE — Patient Instructions (Signed)
Third Trimester of Pregnancy The third trimester is from week 28 through week 40 (months 7 through 9). The third trimester is a time when the unborn baby (fetus) is growing rapidly. At the end of the ninth month, the fetus is about 20 inches in length and weighs 6-10 pounds. Body changes during your third trimester Your body will continue to go through many changes during pregnancy. The changes vary from woman to woman. During the third trimester:  Your weight will continue to increase. You can expect to gain 25-35 pounds (11-16 kg) by the end of the pregnancy.  You may begin to get stretch marks on your hips, abdomen, and breasts.  You may urinate more often because the fetus is moving lower into your pelvis and pressing on your bladder.  You may develop or continue to have heartburn. This is caused by increased hormones that slow down muscles in the digestive tract.  You may develop or continue to have constipation because increased hormones slow digestion and cause the muscles that push waste through your intestines to relax.  You may develop hemorrhoids. These are swollen veins (varicose veins) in the rectum that can itch or be painful.  You may develop swollen, bulging veins (varicose veins) in your legs.  You may have increased body aches in the pelvis, back, or thighs. This is due to weight gain and increased hormones that are relaxing your joints.  You may have changes in your hair. These can include thickening of your hair, rapid growth, and changes in texture. Some women also have hair loss during or after pregnancy, or hair that feels dry or thin. Your hair will most likely return to normal after your baby is born.  Your breasts will continue to grow and they will continue to become tender. A yellow fluid (colostrum) may leak from your breasts. This is the first milk you are producing for your baby.  Your belly button may stick out.  You may notice more swelling in your hands,  face, or ankles.  You may have increased tingling or numbness in your hands, arms, and legs. The skin on your belly may also feel numb.  You may feel short of breath because of your expanding uterus.  You may have more problems sleeping. This can be caused by the size of your belly, increased need to urinate, and an increase in your body's metabolism.  You may notice the fetus "dropping," or moving lower in your abdomen (lightening).  You may have increased vaginal discharge.  You may notice your joints feel loose and you may have pain around your pelvic bone. What to expect at prenatal visits You will have prenatal exams every 2 weeks until week 36. Then you will have weekly prenatal exams. During a routine prenatal visit:  You will be weighed to make sure you and the baby are growing normally.  Your blood pressure will be taken.  Your abdomen will be measured to track your baby's growth.  The fetal heartbeat will be listened to.  Any test results from the previous visit will be discussed.  You may have a cervical check near your due date to see if your cervix has softened or thinned (effaced).  You will be tested for Group B streptococcus. This happens between 35 and 37 weeks. Your health care provider may ask you:  What your birth plan is.  How you are feeling.  If you are feeling the baby move.  If you have had any abnormal   symptoms, such as leaking fluid, bleeding, severe headaches, or abdominal cramping.  If you are using any tobacco products, including cigarettes, chewing tobacco, and electronic cigarettes.  If you have any questions. Other tests or screenings that may be performed during your third trimester include:  Blood tests that check for low iron levels (anemia).  Fetal testing to check the health, activity level, and growth of the fetus. Testing is done if you have certain medical conditions or if there are problems during the pregnancy.  Nonstress test  (NST). This test checks the health of your baby to make sure there are no signs of problems, such as the baby not getting enough oxygen. During this test, a belt is placed around your belly. The baby is made to move, and its heart rate is monitored during movement. What is false labor? False labor is a condition in which you feel small, irregular tightenings of the muscles in the womb (contractions) that usually go away with rest, changing position, or drinking water. These are called Braxton Hicks contractions. Contractions may last for hours, days, or even weeks before true labor sets in. If contractions come at regular intervals, become more frequent, increase in intensity, or become painful, you should see your health care provider. What are the signs of labor?  Abdominal cramps.  Regular contractions that start at 10 minutes apart and become stronger and more frequent with time.  Contractions that start on the top of the uterus and spread down to the lower abdomen and back.  Increased pelvic pressure and dull back pain.  A watery or bloody mucus discharge that comes from the vagina.  Leaking of amniotic fluid. This is also known as your "water breaking." It could be a slow trickle or a gush. Let your health care provider know if it has a color or strange odor. If you have any of these signs, call your health care provider right away, even if it is before your due date. Follow these instructions at home: Medicines  Follow your health care provider's instructions regarding medicine use. Specific medicines may be either safe or unsafe to take during pregnancy.  Take a prenatal vitamin that contains at least 600 micrograms (mcg) of folic acid.  If you develop constipation, try taking a stool softener if your health care provider approves. Eating and drinking   Eat a balanced diet that includes fresh fruits and vegetables, whole grains, good sources of protein such as meat, eggs, or tofu,  and low-fat dairy. Your health care provider will help you determine the amount of weight gain that is right for you.  Avoid raw meat and uncooked cheese. These carry germs that can cause birth defects in the baby.  If you have low calcium intake from food, talk to your health care provider about whether you should take a daily calcium supplement.  Eat four or five small meals rather than three large meals a day.  Limit foods that are high in fat and processed sugars, such as fried and sweet foods.  To prevent constipation: ? Drink enough fluid to keep your urine clear or pale yellow. ? Eat foods that are high in fiber, such as fresh fruits and vegetables, whole grains, and beans. Activity  Exercise only as directed by your health care provider. Most women can continue their usual exercise routine during pregnancy. Try to exercise for 30 minutes at least 5 days a week. Stop exercising if you experience uterine contractions.  Avoid heavy lifting.  Do   not exercise in extreme heat or humidity, or at high altitudes.  Wear low-heel, comfortable shoes.  Practice good posture.  You may continue to have sex unless your health care provider tells you otherwise. Relieving pain and discomfort  Take frequent breaks and rest with your legs elevated if you have leg cramps or low back pain.  Take warm sitz baths to soothe any pain or discomfort caused by hemorrhoids. Use hemorrhoid cream if your health care provider approves.  Wear a good support bra to prevent discomfort from breast tenderness.  If you develop varicose veins: ? Wear support pantyhose or compression stockings as told by your healthcare provider. ? Elevate your feet for 15 minutes, 3-4 times a day. Prenatal care  Write down your questions. Take them to your prenatal visits.  Keep all your prenatal visits as told by your health care provider. This is important. Safety  Wear your seat belt at all times when driving.  Make  a list of emergency phone numbers, including numbers for family, friends, the hospital, and police and fire departments. General instructions  Avoid cat litter boxes and soil used by cats. These carry germs that can cause birth defects in the baby. If you have a cat, ask someone to clean the litter box for you.  Do not travel far distances unless it is absolutely necessary and only with the approval of your health care provider.  Do not use hot tubs, steam rooms, or saunas.  Do not drink alcohol.  Do not use any products that contain nicotine or tobacco, such as cigarettes and e-cigarettes. If you need help quitting, ask your health care provider.  Do not use any medicinal herbs or unprescribed drugs. These chemicals affect the formation and growth of the baby.  Do not douche or use tampons or scented sanitary pads.  Do not cross your legs for long periods of time.  To prepare for the arrival of your baby: ? Take prenatal classes to understand, practice, and ask questions about labor and delivery. ? Make a trial run to the hospital. ? Visit the hospital and tour the maternity area. ? Arrange for maternity or paternity leave through employers. ? Arrange for family and friends to take care of pets while you are in the hospital. ? Purchase a rear-facing car seat and make sure you know how to install it in your car. ? Pack your hospital bag. ? Prepare the baby's nursery. Make sure to remove all pillows and stuffed animals from the baby's crib to prevent suffocation.  Visit your dentist if you have not gone during your pregnancy. Use a soft toothbrush to brush your teeth and be gentle when you floss. Contact a health care provider if:  You are unsure if you are in labor or if your water has broken.  You become dizzy.  You have mild pelvic cramps, pelvic pressure, or nagging pain in your abdominal area.  You have lower back pain.  You have persistent nausea, vomiting, or  diarrhea.  You have an unusual or bad smelling vaginal discharge.  You have pain when you urinate. Get help right away if:  Your water breaks before 37 weeks.  You have regular contractions less than 5 minutes apart before 37 weeks.  You have a fever.  You are leaking fluid from your vagina.  You have spotting or bleeding from your vagina.  You have severe abdominal pain or cramping.  You have rapid weight loss or weight gain.  You have   shortness of breath with chest pain.  You notice sudden or extreme swelling of your face, hands, ankles, feet, or legs.  Your baby makes fewer than 10 movements in 2 hours.  You have severe headaches that do not go away when you take medicine.  You have vision changes. Summary  The third trimester is from week 28 through week 40, months 7 through 9. The third trimester is a time when the unborn baby (fetus) is growing rapidly.  During the third trimester, your discomfort may increase as you and your baby continue to gain weight. You may have abdominal, leg, and back pain, sleeping problems, and an increased need to urinate.  During the third trimester your breasts will keep growing and they will continue to become tender. A yellow fluid (colostrum) may leak from your breasts. This is the first milk you are producing for your baby.  False labor is a condition in which you feel small, irregular tightenings of the muscles in the womb (contractions) that eventually go away. These are called Braxton Hicks contractions. Contractions may last for hours, days, or even weeks before true labor sets in.  Signs of labor can include: abdominal cramps; regular contractions that start at 10 minutes apart and become stronger and more frequent with time; watery or bloody mucus discharge that comes from the vagina; increased pelvic pressure and dull back pain; and leaking of amniotic fluid. This information is not intended to replace advice given to you by your  health care provider. Make sure you discuss any questions you have with your health care provider. Document Released: 09/19/2001 Document Revised: 10/31/2016 Document Reviewed: 10/31/2016 Elsevier Interactive Patient Education  2019 [employer]  

## 2018-11-07 NOTE — Progress Notes (Signed)
Subjective:  Joanne Cordova is a 18 y.o. G2P0010 at [redacted]w[redacted]d being seen today for ongoing prenatal care.  She is currently monitored for the following issues for this low-risk pregnancy and has Supervision of normal pregnancy, antepartum and URI (upper respiratory infection) on their problem list.  Patient reports URI Sx for over a week now. .  Contractions: Not present. Vag. Bleeding: None.  Movement: Present. Denies leaking of fluid.   The following portions of the patient's history were reviewed and updated as appropriate: allergies, current medications, past family history, past medical history, past social history, past surgical history and problem list. Problem list updated.  Objective:   Vitals:   11/07/18 0852  BP: 122/75  Pulse: 99  Weight: 161 lb 11.2 oz (73.3 kg)    Fetal Status: Fetal Heart Rate (bpm): 154   Movement: Present     General:  Alert, oriented and cooperative. Patient is in no acute distress.  Skin: Skin is warm and dry. No rash noted.   Cardiovascular: Normal heart rate noted  Respiratory: Normal respiratory effort, no problems with respiration noted  Abdomen: Soft, gravid, appropriate for gestational age. Pain/Pressure: Absent     Pelvic:  Cervical exam deferred        Extremities: Normal range of motion.  Edema: None  Mental Status: Normal mood and affect. Normal behavior. Normal judgment and thought content.   Urinalysis:      Assessment and Plan:  Pregnancy: G2P0010 at [redacted]w[redacted]d  1. Supervision of normal first pregnancy, antepartum Stable - Glucose Tolerance, 2 Hours w/1 Hour - CBC - RPR - HIV Antibody (routine testing w rflx)  2. Upper respiratory tract infection, unspecified type  - azithromycin (ZITHROMAX) 250 MG tablet; Take as directed: Two pills by mouth the first day, then one pill every day until completed  Dispense: 6 tablet; Refill: 1  Preterm labor symptoms and general obstetric precautions including but not limited to vaginal bleeding,  contractions, leaking of fluid and fetal movement were reviewed in detail with the patient. Please refer to After Visit Summary for other counseling recommendations.  Return in about 2 weeks (around 11/21/2018) for OB visit.   Hermina Staggers, MD

## 2018-11-08 LAB — CBC
Hematocrit: 32 % — ABNORMAL LOW (ref 34.0–46.6)
Hemoglobin: 11.1 g/dL (ref 11.1–15.9)
MCH: 30.2 pg (ref 26.6–33.0)
MCHC: 34.7 g/dL (ref 31.5–35.7)
MCV: 87 fL (ref 79–97)
Platelets: 204 10*3/uL (ref 150–450)
RBC: 3.67 x10E6/uL — ABNORMAL LOW (ref 3.77–5.28)
RDW: 12.2 % (ref 11.7–15.4)
WBC: 7.4 10*3/uL (ref 3.4–10.8)

## 2018-11-08 LAB — RPR: RPR Ser Ql: NONREACTIVE

## 2018-11-08 LAB — GLUCOSE TOLERANCE, 2 HOURS W/ 1HR
Glucose, 1 hour: 144 mg/dL (ref 65–179)
Glucose, 2 hour: 81 mg/dL (ref 65–152)
Glucose, Fasting: 95 mg/dL — ABNORMAL HIGH (ref 65–91)

## 2018-11-08 LAB — HIV ANTIBODY (ROUTINE TESTING W REFLEX): HIV Screen 4th Generation wRfx: NONREACTIVE

## 2018-11-11 ENCOUNTER — Other Ambulatory Visit: Payer: Self-pay

## 2018-11-11 DIAGNOSIS — Z3400 Encounter for supervision of normal first pregnancy, unspecified trimester: Secondary | ICD-10-CM

## 2018-11-11 MED ORDER — GLUCOSE BLOOD VI STRP: ORAL_STRIP | 12 refills | Status: DC

## 2018-11-11 MED ORDER — BLOOD GLUCOSE MONITORING SUPPL W/DEVICE KIT: PACK | 0 refills | Status: DC

## 2018-11-11 MED ORDER — ACCU-CHEK FASTCLIX LANCETS MISC: 1.0000 [IU] | Freq: Four times a day (QID) | 12 refills | Status: DC

## 2018-11-13 ENCOUNTER — Encounter: Payer: Self-pay | Admitting: Registered"

## 2018-11-13 ENCOUNTER — Encounter: Payer: Medicaid Other | Attending: Obstetrics and Gynecology | Admitting: Registered"

## 2018-11-13 DIAGNOSIS — O99810 Abnormal glucose complicating pregnancy: Secondary | ICD-10-CM | POA: Diagnosis not present

## 2018-11-13 NOTE — Progress Notes (Signed)
Patient was seen on 11/13/18 for Gestational Diabetes self-management class at the [facility]. The following learning objectives were met by the patient during this course:   States the definition of Gestational Diabetes  States why dietary management is important in controlling blood glucose  Describes the effects each nutrient has on blood glucose levels  Demonstrates ability to create a balanced meal plan  Demonstrates carbohydrate counting   States when to check blood glucose levels  Demonstrates proper blood glucose monitoring techniques  States the effect of stress and exercise on blood glucose levels  States the importance of limiting caffeine and abstaining from alcohol and smoking  Blood glucose monitor given: none  Patient instructed to monitor glucose levels: FBS: 60 - <95; 1 hour: <140; 2 hour: <120  Patient received handouts:  Nutrition Diabetes and Pregnancy, including carb counting list  Patient will be seen for follow-up as needed.

## 2018-11-19 ENCOUNTER — Telehealth: Payer: Self-pay | Admitting: Licensed Clinical Social Worker

## 2018-11-19 NOTE — Telephone Encounter (Signed)
Pt called for appt reminder. Voicemail left.

## 2018-11-21 ENCOUNTER — Encounter: Payer: Self-pay | Admitting: Certified Nurse Midwife

## 2018-11-21 ENCOUNTER — Other Ambulatory Visit: Payer: Self-pay

## 2018-11-21 ENCOUNTER — Inpatient Hospital Stay (HOSPITAL_COMMUNITY)
Admission: AD | Admit: 2018-11-21 | Discharge: 2018-11-21 | Disposition: A | Discharge status: Home / Self Care | Payer: Medicaid Other | Attending: Obstetrics and Gynecology | Admitting: Obstetrics and Gynecology

## 2018-11-21 ENCOUNTER — Encounter: Payer: Self-pay | Admitting: Obstetrics

## 2018-11-21 ENCOUNTER — Ambulatory Visit (INDEPENDENT_AMBULATORY_CARE_PROVIDER_SITE_OTHER): Payer: Medicaid Other | Admitting: Certified Nurse Midwife

## 2018-11-21 ENCOUNTER — Encounter (HOSPITAL_COMMUNITY): Payer: Self-pay | Admitting: *Deleted

## 2018-11-21 VITALS — BP 127/73 | HR 111 | Wt 167.0 lb

## 2018-11-21 DIAGNOSIS — O24410 Gestational diabetes mellitus in pregnancy, diet controlled: Secondary | ICD-10-CM

## 2018-11-21 DIAGNOSIS — R109 Unspecified abdominal pain: Secondary | ICD-10-CM | POA: Diagnosis present

## 2018-11-21 DIAGNOSIS — O26899 Other specified pregnancy related conditions, unspecified trimester: Secondary | ICD-10-CM

## 2018-11-21 DIAGNOSIS — O26893 Other specified pregnancy related conditions, third trimester: Secondary | ICD-10-CM | POA: Insufficient documentation

## 2018-11-21 DIAGNOSIS — Z3A30 30 weeks gestation of pregnancy: Secondary | ICD-10-CM | POA: Diagnosis not present

## 2018-11-21 DIAGNOSIS — Z3403 Encounter for supervision of normal first pregnancy, third trimester: Secondary | ICD-10-CM

## 2018-11-21 DIAGNOSIS — Z3400 Encounter for supervision of normal first pregnancy, unspecified trimester: Secondary | ICD-10-CM

## 2018-11-21 DIAGNOSIS — O24415 Gestational diabetes mellitus in pregnancy, controlled by oral hypoglycemic drugs: Secondary | ICD-10-CM | POA: Insufficient documentation

## 2018-11-21 HISTORY — DX: Gestational diabetes mellitus in pregnancy, diet controlled: O24.410

## 2018-11-21 HISTORY — DX: Gestational diabetes mellitus in pregnancy, unspecified control: O24.419

## 2018-11-21 HISTORY — DX: Type 2 diabetes mellitus without complications: E11.9

## 2018-11-21 LAB — URINALYSIS, ROUTINE W REFLEX MICROSCOPIC
Bilirubin Urine: NEGATIVE
Glucose, UA: NEGATIVE mg/dL
Ketones, ur: NEGATIVE mg/dL
Leukocytes,Ua: NEGATIVE
Nitrite: NEGATIVE
Protein, ur: NEGATIVE mg/dL
Specific Gravity, Urine: 1.01 (ref 1.005–1.030)
pH: 6.5 (ref 5.0–8.0)

## 2018-11-21 LAB — URINALYSIS, MICROSCOPIC (REFLEX)

## 2018-11-21 MED ORDER — CYCLOBENZAPRINE HCL 5 MG PO TABS
5.0000 mg | ORAL_TABLET | Freq: Once | ORAL | Status: AC
  Administered 2018-11-21: 5 mg via ORAL
  Filled 2018-11-21: qty 1

## 2018-11-21 MED ORDER — ACETAMINOPHEN 500 MG PO TABS
1000.0000 mg | ORAL_TABLET | Freq: Once | ORAL | Status: AC
  Administered 2018-11-21: 1000 mg via ORAL
  Filled 2018-11-21: qty 2

## 2018-11-21 NOTE — MAU Provider Note (Signed)
Chief Complaint:  Abdominal Pain   First Provider Initiated Contact with Patient 11/21/18 1748     HPI: Joanne Cordova is a 18 y.o. G2P0010 at 65w1dwho presents to maternity admissions reporting abdominal pain. Symptoms began this morning and have worsened during the day. Reports left sided abdominal pain and left lower back pain. Pain is constant & worse with lying down. Nothing makes pain better. Denies fever/chills, n/v/d, constipation, dysuria, hematuria. Last BM was this morning & was a little loose. Denies any falls or trauma. Hasn't treated symptoms.  Denies contractions, leakage of fluid or vaginal bleeding. Good fetal movement.  Location: left sided abdomen & left lower back.  Quality: sharp & cramping Severity: 10/10 in pain scale Duration: <1 day Timing: constant Modifying factors: worse when lying down Associated signs and symptoms: none  Past Medical History:  Diagnosis Date  . [place]   . Gestational diabetes   . Hx of chlamydia infection 2018  . Hx of gonorrhea 2018   OB History  Gravida Para Term Preterm AB Living  2       1    SAB TAB Ectopic Multiple Live Births  1            # Outcome Date GA Lbr Len/2nd Weight Sex Delivery Anes PTL Lv  2 Current           1 SAB 2018           Past Surgical History:  Procedure Laterality Date  . NO PAST SURGERIES     Family History  Problem Relation Age of Onset  . Cervical cancer Mother   . Diabetes Maternal Grandmother    Social History   Tobacco Use  . Smoking status: Never Smoker  . Smokeless tobacco: Never Used  Substance Use Topics  . Alcohol use: Not Currently    Comment: socially  . Drug use: Not Currently    Types: Marijuana   No Known Allergies Medications Prior to Admission  Medication Sig Dispense Refill Last Dose  . ACCU-CHEK FASTCLIX LANCETS MISC 1 Units by Percutaneous route 4 (four) times daily. 100 each 12   . acetaminophen (TYLENOL) 500 MG tablet  Take 2 tablets (1,000 mg total) by mouth every 6 (six) hours as needed for moderate pain. 30 tablet 2 Taking  . azithromycin (ZITHROMAX) 250 MG tablet Take as directed: Two pills by mouth the first day, then one pill every day until completed 6 tablet 1   . Blood Glucose Monitoring Suppl w/Device KIT Check 4 times a day 1 each 0   . docusate sodium (COLACE) 250 MG capsule Take 1 capsule (250 mg total) by mouth daily. 10 capsule 0 Taking  . glucose blood test strip Use as instructed 100 each 12   . Prenatal MV & Min w/FA-DHA (PRENATAL ADULT GUMMY/DHA/FA) 0.4-25 MG CHEW Chew 2 tablets by mouth daily. 30 tablet 6 Taking    I have reviewed patient's Past Medical Hx, Surgical Hx, Family Hx, Social Hx, medications and allergies.   ROS:  Review of Systems  Constitutional: Negative.   Gastrointestinal: Positive for abdominal pain. Negative for constipation, diarrhea, nausea and vomiting.  Genitourinary: Negative.   Musculoskeletal: Positive for back pain.    Physical Exam   Patient Vitals for the past 24 hrs:  BP Temp Temp src Pulse Resp SpO2 Weight  11/21/18 1716 132/64 97.8 F (36.6 C) Oral (!) 115 20 98 % 75.2 kg    Constitutional: Well-developed, well-nourished  female in no acute distress.  Cardiovascular: normal rate & rhythm, no murmur Respiratory: normal effort, lung sounds clear throughout GI: Abd soft, non-tender, gravid appropriate for gestational age. Pos BS x 4 MS: Extremities nontender, no edema, normal ROM Neurologic: Alert and oriented x 4.  GU:     Dilation: Closed Effacement (%): Thick Cervical Position: Posterior Exam by:: Robyne Askew, NP  NST:  Baseline: 130 bpm, Variability: Good {> 6 bpm), Accelerations: Reactive and Decelerations: Absent   Labs: Results for orders placed or performed during the hospital encounter of 11/21/18 (from the past 24 hour(s))  Urinalysis, Routine w reflex microscopic     Status: Abnormal   Collection Time: 11/21/18  5:26 PM  Result  Value Ref Range   Color, Urine YELLOW YELLOW   APPearance CLEAR CLEAR   Specific Gravity, Urine 1.010 1.005 - 1.030   pH 6.5 5.0 - 8.0   Glucose, UA NEGATIVE NEGATIVE mg/dL   Hgb urine dipstick SMALL (A) NEGATIVE   Bilirubin Urine NEGATIVE NEGATIVE   Ketones, ur NEGATIVE NEGATIVE mg/dL   Protein, ur NEGATIVE NEGATIVE mg/dL   Nitrite NEGATIVE NEGATIVE   Leukocytes,Ua NEGATIVE NEGATIVE  Urinalysis, Microscopic (reflex)     Status: Abnormal   Collection Time: 11/21/18  5:26 PM  Result Value Ref Range   RBC / HPF 0-5 0 - 5 RBC/hpf   WBC, UA 0-5 0 - 5 WBC/hpf   Bacteria, UA MANY (A) NONE SEEN   Squamous Epithelial / LPF 6-10 0 - 5    Imaging:  No results found.  MAU Course: Orders Placed This Encounter  Procedures  . Culture, OB Urine  . Urinalysis, Routine w reflex microscopic  . Urinalysis, Microscopic (reflex)  . Discharge patient   Meds ordered this encounter  Medications  . acetaminophen (TYLENOL) tablet 1,000 mg  . cyclobenzaprine (FLEXERIL) tablet 5 mg    MDM: Reactive NST. No regular contractions. Cervix closed/thick.   Some TTP on left side of abdomen. No CVAT. No rebound. U/a with small amount of hemoglobin -- sent for culture.  Tylenol & flexeril given. Pain completely resolved after meds. Pain likely MSK. Discharge home in stable condition.   Assessment: 1. Abdominal pain affecting pregnancy   2. [redacted] weeks gestation of pregnancy     Plan: Discharge home in stable condition.  Preterm Labor precautions and fetal kick counts   Allergies as of 11/21/2018   No Known Allergies     Medication List    TAKE these medications   ACCU-CHEK FASTCLIX LANCETS Misc 1 Units by Percutaneous route 4 (four) times daily.   acetaminophen 500 MG tablet Commonly known as:  TYLENOL Take 2 tablets (1,000 mg total) by mouth every 6 (six) hours as needed for moderate pain.   azithromycin 250 MG tablet Commonly known as:  ZITHROMAX Take as directed: Two pills by mouth the  first day, then one pill every day until completed   Blood Glucose Monitoring Suppl w/Device Kit Check 4 times a day   docusate sodium 250 MG capsule Commonly known as:  COLACE Take 1 capsule (250 mg total) by mouth daily.   glucose blood test strip Use as instructed   Prenatal Adult Gummy/DHA/FA 0.4-25 MG Chew Chew 2 tablets by mouth daily.       Jorje Guild, NP 11/21/2018 7:46 PM

## 2018-11-21 NOTE — Progress Notes (Signed)
   PRENATAL VISIT NOTE  Subjective:  Joanne Cordova is a 18 y.o. G2P0010 at [redacted]w[redacted]d being seen today for ongoing prenatal care.  She is currently monitored for the following issues for this low-risk pregnancy and has Supervision of normal pregnancy, antepartum; URI (upper respiratory infection); Abnormal glucose tolerance test (GTT) during pregnancy, antepartum; and Diet controlled gestational diabetes mellitus (GDM) in third trimester on their problem list.  Patient reports no complaints.  Contractions: Not present. Vag. Bleeding: None.  Movement: Present. Denies leaking of fluid.   The following portions of the patient's history were reviewed and updated as appropriate: allergies, current medications, past family history, past medical history, past social history, past surgical history and problem list. Problem list updated.  Objective:   Vitals:   11/21/18 1013  BP: 127/73  Pulse: (!) 111  Weight: 167 lb (75.8 kg)    Fetal Status: Fetal Heart Rate (bpm): 140 Fundal Height: 28 cm Movement: Present     General:  Alert, oriented and cooperative. Patient is in no acute distress.  Skin: Skin is warm and dry. No rash noted.   Cardiovascular: Normal heart rate noted  Respiratory: Normal respiratory effort, no problems with respiration noted  Abdomen: Soft, gravid, appropriate for gestational age.  Pain/Pressure: Absent     Pelvic: Cervical exam deferred        Extremities: Normal range of motion.  Edema: None  Mental Status: Normal mood and affect. Normal behavior. Normal judgment and thought content.   Assessment and Plan:  Pregnancy: G2P0010 at [redacted]w[redacted]d  1. Supervision of normal first pregnancy, antepartum - Patient doing well, no complaints - Anticipatory guidance on upcoming appointments - Routine prenatal care   2. Diet controlled gestational diabetes mellitus (GDM) in third trimester - Patient forgot log today - Verbally states glucose ranges from 82-90 throughout the day  including fasting and postprandial.  - Reports one elevated CBG of 122 PP but no other elevated glucose  - Encouraged to bring log at next prenatal visit and each one following   Preterm labor symptoms and general obstetric precautions including but not limited to vaginal bleeding, contractions, leaking of fluid and fetal movement were reviewed in detail with the patient. Please refer to After Visit Summary for other counseling recommendations.  Return in about 2 weeks (around 12/05/2018) for ROB.  Future Appointments  Date Time Provider Department Center  12/05/2018  8:15 AM Sharyon Cable, CNM [facility] None    Sharyon Cable, CNM

## 2018-11-21 NOTE — Patient Instructions (Signed)
Gestational Diabetes Mellitus, Diagnosis Gestational diabetes (gestational diabetes mellitus) is a short-term (temporary) form of diabetes that can happen during pregnancy. It goes away after you give birth. It may be caused by one or both of these problems:  Your pancreas does not make enough of a hormone called insulin.  Your body does not respond in a normal way to insulin that it makes. Insulin lets sugars (glucose) go into cells in the body. This gives you energy. If you have diabetes, sugars cannot get into cells. This causes high blood sugar (hyperglycemia). If you get gestational diabetes, you are:  More likely to get it if you get pregnant again.  More likely to develop type 2 diabetes in the future. If gestational diabetes is treated, it may not hurt you or your baby. Your doctor will set treatment goals for you. In general, you should have these blood sugar levels:  After not eating for a long time (fasting): 95 mg/dL (5.3 mmol/L).  After meals (postprandial): ? One hour after a meal: at or below 140 mg/dL (7.8 mmol/L). ? Two hours after a meal: at or below 120 mg/dL (6.7 mmol/L).  A1c (hemoglobin A1c) level: 6-6.5%. Follow these instructions at home: Questions to ask your doctor   You may want to ask these questions: ? Do I need to meet with a diabetes educator? ? What equipment will I need to care for myself at home? ? What medicines do I need? When should I take them? ? How often do I need to check my blood sugar? ? What number can I call if I have questions? ? When is my next doctor's visit? General instructions  Take over-the-counter and prescription medicines only as told by your doctor.  Stay at a healthy weight during pregnancy.  Keep all follow-up visits as told by your doctor. This is important. Contact a doctor if:  Your blood sugar is at or above 240 mg/dL (13.3 mmol/L).  Your blood sugar is at or above 200 mg/dL (11.1 mmol/L) and you have ketones in  your pee (urine).  You have been sick or have had a fever for 2 days or more and you are not getting better.  You have any of these problems for more than 6 hours: ? You cannot eat or drink. ? You feel sick to your stomach (nauseous). ? You throw up (vomit). ? You have watery poop (diarrhea). Get help right away if:  Your blood sugar is lower than 54 mg/dL (3 mmol/L).  You get confused.  You have trouble: ? Thinking clearly. ? Breathing.  Your baby moves less than normal.  You have any of these: ? Moderate or large ketone levels in your pee. ? Blood coming from your vagina. ? Unusual fluid coming from your vagina. ? Early contractions. These may feel like tightness in your belly. Summary  Gestational diabetes is a short-term form of diabetes. It can happen while you are pregnant. It goes away after you give birth.  If gestational diabetes is treated, it may not hurt you or your baby. Your doctor will set treatment goals for you.  Keep all follow-up visits as told by your doctor. This is important. This information is not intended to replace advice given to you by your health care provider. Make sure you discuss any questions you have with your health care provider. Document Released: 01/17/2016 Document Revised: 05/21/2017 Document Reviewed: 10/29/2015 Elsevier Interactive Patient Education  2019 Else[employer] Inc[employer]  

## 2018-11-21 NOTE — Progress Notes (Signed)
Pt states her sugars have been fine.

## 2018-11-21 NOTE — MAU Note (Signed)
Pain in LLQ, around to back. Constant pain, but gets sharper.  Pain started this morning, but wasn't this bad. No bleeding or leaking.

## 2018-11-21 NOTE — Discharge Instructions (Signed)
Warning Signs During Pregnancy °A pregnancy lasts about 40 weeks, starting from the first day of your last period until the baby is born. Pregnancy is divided into three phases called trimesters. °· The first trimester refers to week 1 through week 13 of pregnancy. °· The second trimester is the start of week 14 through the end of week 27. °· The third trimester is the start of week 28 until you deliver your baby. °During each trimester of pregnancy, certain signs and symptoms may indicate a problem. Talk with your health care provider about your current health and any medical conditions you have. Make sure you know the symptoms that you should watch for and report. °How does this affect me? ° °Warning signs in the first trimester °While some changes during the first trimester may be uncomfortable, most do not represent a serious problem. Let your health care provider know if you have any of the following warning signs in the first trimester: °· You cannot eat or drink without vomiting, and this lasts for longer than a day. °· You have vaginal bleeding or spotting along with menstrual-like cramping. °· You have diarrhea for longer than a day. °· You have a fever or other signs of infection, such as: °? Pain or burning when you urinate. °? Foul smelling or thick or yellowish vaginal discharge. °Warning signs in the second trimester °As your baby grows and changes during the second trimester, there are additional signs and symptoms that may indicate a problem. These include: °· Signs and symptoms of infection, including a fever. °· Signs or symptoms of a miscarriage or preterm labor, such as regular contractions, menstrual-like cramping, or lower abdominal pain. °· Bloody or watery vaginal discharge or obvious vaginal bleeding. °· Feeling like your heart is pounding. °· Having trouble breathing. °· Nausea, vomiting, or diarrhea that lasts for longer than a day. °· Craving non-food items, such as clay, chalk, or dirt.  This may be a sign of a very treatable medical condition called pica. °Later in your second trimester, watch for signs and symptoms of a serious medical condition called preeclampsia.These include: °· Changes in your vision. °· A severe headache that does not go away. °· Nausea and vomiting. °It is also important to notice if your baby stops moving or moves less than usual during this time. °Warning signs in the third trimester °As you approach the third trimester, your baby is growing and your body is preparing for the birth of your baby. In your third trimester, be sure to let your health care provider know if: °· You have signs and symptoms of infection, including a fever. °· You have vaginal bleeding. °· You notice that your baby is moving less than usual or is not moving. °· You have nausea, vomiting, or diarrhea that lasts for longer than a day. °· You have a severe headache that does not go away. °· You have vision changes, including seeing spots or having blurry or double vision. °· You have increased swelling in your hands or face. °How does this affect my baby? °Throughout your pregnancy, always report any of the warning signs of a problem to your health care provider. This can help prevent complications that may affect your baby, including: °· Increased risk for premature birth. °· Infection that may be transmitted to your baby. °· Increased risk for stillbirth. °Contact a health care provider if: °· You have any of the warning signs of a problem for the current trimester of your pregnancy. °·   Any of the following apply to you during any trimester of pregnancy: °? You have strong emotions, such as sadness or anxiety, that interfere with work or personal relationships. °? You feel unsafe in your home and need help finding a safe place to live. °? You are using tobacco products, alcohol, or drugs and you need help to stop. °Get help right away if: °You have signs or symptoms of labor before 37 weeks of  pregnancy. These include: °· Contractions that are 5 minutes or less apart, or that increase in frequency, intensity, or length. °· Sudden, sharp abdominal pain or low back pain. °· Uncontrolled gush or trickle of fluid from your vagina. °Summary °· A pregnancy lasts about 40 weeks, starting from the first day of your last period until the baby is born. Pregnancy is divided into three phases called trimesters. Each trimester has warning signs to watch for. °· Always report any warning signs to your health care provider in order to prevent complications that may affect both you and your baby. °· Talk with your health care provider about your current health and any medical conditions you have. Make sure you know the symptoms that you should watch for and report. °This information is not intended to replace advice given to you by your health care provider. Make sure you discuss any questions you have with your health care provider. °Document Released: 07/12/2017 Document Revised: 07/12/2017 Document Reviewed: 07/12/2017 °Elsevier Interactive Patient Education © 2019 [employer] Inc. ° ° ° ° °Fetal Movement Counts °Patient Name: ________________________________________________ Patient Due Date: ____________________ °What is a fetal movement count? ° °A fetal movement count is the number of times that you feel your baby move during a certain amount of time. This may also be called a fetal kick count. A fetal movement count is recommended for every pregnant woman. You may be asked to start counting fetal movements as early as week 28 of your pregnancy. °Pay attention to when your baby is most active. You may notice your baby's sleep and wake cycles. You may also notice things that make your baby move more. You should do a fetal movement count: °· When your baby is normally most active. °· At the same time each day. °A good time to count movements is while you are resting, after having something to eat and drink. °How do I  count fetal movements? °1. Find a quiet, comfortable area. Sit, or lie down on your side. °2. Write down the date, the start time and stop time, and the number of movements that you felt between those two times. Take this information with you to your health care visits. °3. For 2 hours, count kicks, flutters, swishes, rolls, and jabs. You should feel at least 10 movements during 2 hours. °4. You may stop counting after you have felt 10 movements. °5. If you do not feel 10 movements in 2 hours, have something to eat and drink. Then, keep resting and counting for 1 hour. If you feel at least 4 movements during that hour, you may stop counting. °Contact a health care provider if: °· You feel fewer than 4 movements in 2 hours. °· Your baby is not moving like he or she usually does. °Date: ____________ Start time: ____________ Stop time: ____________ Movements: ____________ °Date: ____________ Start time: ____________ Stop time: ____________ Movements: ____________ °Date: ____________ Start time: ____________ Stop time: ____________ Movements: ____________ °Date: ____________ Start time: ____________ Stop time: ____________ Movements: ____________ °Date: ____________ Start time: ____________ Stop time:   ____________ Movements: ____________ °Date: ____________ Start time: ____________ Stop time: ____________ Movements: ____________ °Date: ____________ Start time: ____________ Stop time: ____________ Movements: ____________ °Date: ____________ Start time: ____________ Stop time: ____________ Movements: ____________ °Date: ____________ Start time: ____________ Stop time: ____________ Movements: ____________ °This information is not intended to replace advice given to you by your health care provider. Make sure you discuss any questions you have with your health care provider. °Document Released: 10/25/2006 Document Revised: 05/24/2016 Document Reviewed: 11/04/2015 °Elsevier Interactive Patient Education © 2019 Elsevier  Inc. ° °

## 2018-11-23 LAB — CULTURE, OB URINE: Culture: NO GROWTH

## 2018-12-05 ENCOUNTER — Ambulatory Visit (INDEPENDENT_AMBULATORY_CARE_PROVIDER_SITE_OTHER): Payer: Medicaid Other | Admitting: Certified Nurse Midwife

## 2018-12-05 ENCOUNTER — Encounter: Payer: Self-pay | Admitting: Certified Nurse Midwife

## 2018-12-05 VITALS — BP 124/80 | HR 114 | Wt 171.0 lb

## 2018-12-05 DIAGNOSIS — O24410 Gestational diabetes mellitus in pregnancy, diet controlled: Secondary | ICD-10-CM

## 2018-12-05 DIAGNOSIS — Z3A32 32 weeks gestation of pregnancy: Secondary | ICD-10-CM

## 2018-12-05 DIAGNOSIS — Z3483 Encounter for supervision of other normal pregnancy, third trimester: Secondary | ICD-10-CM

## 2018-12-05 DIAGNOSIS — Z3480 Encounter for supervision of other normal pregnancy, unspecified trimester: Secondary | ICD-10-CM

## 2018-12-05 NOTE — Progress Notes (Signed)
   PRENATAL VISIT NOTE  Subjective:  Joanne Cordova is a 18 y.o. G2P0010 at [redacted]w[redacted]d being seen today for ongoing prenatal care.  She is currently monitored for the following issues for this high-risk pregnancy and has Supervision of normal pregnancy, antepartum; URI (upper respiratory infection); Abnormal glucose tolerance test (GTT) during pregnancy, antepartum; and Diet controlled gestational diabetes mellitus (GDM) in third trimester on their problem list.  Patient reports no complaints.  Contractions: Not present. Vag. Bleeding: None.  Movement: Present. Denies leaking of fluid.   The following portions of the patient's history were reviewed and updated as appropriate: allergies, current medications, past family history, past medical history, past social history, past surgical history and problem list. Problem list updated.  Objective:   Vitals:   12/05/18 0820  BP: 124/80  Pulse: (!) 114  Weight: 171 lb (77.6 kg)    Fetal Status: Fetal Heart Rate (bpm): 140 Fundal Height: 30 cm Movement: Present     General:  Alert, oriented and cooperative. Patient is in no acute distress.  Skin: Skin is warm and dry. No rash noted.   Cardiovascular: Normal heart rate noted  Respiratory: Normal respiratory effort, no problems with respiration noted  Abdomen: Soft, gravid, appropriate for gestational age.  Pain/Pressure: Absent     Pelvic: Cervical exam deferred        Extremities: Normal range of motion.  Edema: None  Mental Status: Normal mood and affect. Normal behavior. Normal judgment and thought content.   Assessment and Plan:  Pregnancy: G2P0010 at [redacted]w[redacted]d  1. Supervision of other normal pregnancy, antepartum - Patient doing well, no complaints - Anticipatory guidance on upcoming appointments  - Routine prenatal care   2. Diet controlled gestational diabetes mellitus (GDM) in third trimester - Patient reports she "forgot log at home on the counter", reports she has been taking CBGs  regularly and reports fasting being 95-100 and PP 120-140 on occasions.  - Educated and discussed the importance of management of GDM and the risk to fetus with death and or NICU admission after delivery, patient verbalizes understanding. Patient reports that she is "always hungry and eats fast food, gets skittles icee from [employer]"  - Educated on skittle icee being sugar and ice and need to stop drinking that immediately, discussed substituting icee for fruit/vegie smoothie instead, patient verbalizes understanding  - Discussed need to send picture of log on MyChart when she gets home so that I can evaluate possible need for medication, patient verbalizes understanding. Patient reports that she does not want to take insulin. Educated on need to follow diet plan if she does not want to be on insulin or oral hypoglycemic agent.   Preterm labor symptoms and general obstetric precautions including but not limited to vaginal bleeding, contractions, leaking of fluid and fetal movement were reviewed in detail with the patient. Please refer to After Visit Summary for other counseling recommendations.  Return in about 2 weeks (around 12/19/2018) for ROB.  Future Appointments  Date Time Provider Department Center  12/18/2018  2:45 PM Sharyon Cable, CNM [facility] None    Sharyon Cable, CNM

## 2018-12-05 NOTE — Progress Notes (Signed)
Pt is here for ROB. [redacted]w[redacted]d.  

## 2018-12-05 NOTE — Patient Instructions (Signed)

## 2018-12-06 ENCOUNTER — Telehealth: Payer: Self-pay

## 2018-12-06 ENCOUNTER — Encounter: Payer: Self-pay | Admitting: Certified Nurse Midwife

## 2018-12-06 ENCOUNTER — Other Ambulatory Visit: Payer: Self-pay | Admitting: Certified Nurse Midwife

## 2018-12-06 DIAGNOSIS — O24415 Gestational diabetes mellitus in pregnancy, controlled by oral hypoglycemic drugs: Secondary | ICD-10-CM

## 2018-12-06 DIAGNOSIS — O24410 Gestational diabetes mellitus in pregnancy, diet controlled: Secondary | ICD-10-CM

## 2018-12-06 MED ORDER — METFORMIN HCL 500 MG PO TABS: 500.0000 mg | ORAL_TABLET | Freq: Two times a day (BID) | ORAL | 1 refills | Status: DC

## 2018-12-06 NOTE — Telephone Encounter (Signed)
Per provider request -   Called patient  - advised of provider notation; made referral to Nutrition and Diabetes Service for insulin instructions.   Patient stated she understood and no further questions.

## 2018-12-06 NOTE — Progress Notes (Signed)
Patient called no answer. Discussed plan of care with Dr Macon Large who recommends Metformin BID and additional appointment with Diabetes/Nutrition to reevaluate diet.   Rx for Metformin sent to pharmacy and order for BPP placed   Sharyon Cable, CNM 12/06/18, 11:40 AM

## 2018-12-06 NOTE — Telephone Encounter (Signed)
Per provider -   Called patient to advise of change in her Diabetes therapy - DPR checked - different number listed - LMOVMTC re change in Diabetes therapy.

## 2018-12-06 NOTE — Telephone Encounter (Signed)
-----   Message from Sharyon Cable, CNM sent at 12/06/2018 10:36 AM EST ----- Please call patient and set patient up with diabetes education class for insulin. Patient was to sent MyChart message with glucose log that has not been brought in 4 weeks.   Thank you  Steward Drone CNM

## 2018-12-18 ENCOUNTER — Encounter: Payer: Self-pay | Admitting: Certified Nurse Midwife

## 2018-12-18 ENCOUNTER — Ambulatory Visit (INDEPENDENT_AMBULATORY_CARE_PROVIDER_SITE_OTHER): Payer: Medicaid Other | Admitting: Certified Nurse Midwife

## 2018-12-18 ENCOUNTER — Other Ambulatory Visit: Payer: Self-pay

## 2018-12-18 VITALS — BP 132/74 | HR 104 | Wt 172.0 lb

## 2018-12-18 DIAGNOSIS — O24419 Gestational diabetes mellitus in pregnancy, unspecified control: Secondary | ICD-10-CM | POA: Insufficient documentation

## 2018-12-18 DIAGNOSIS — O24410 Gestational diabetes mellitus in pregnancy, diet controlled: Secondary | ICD-10-CM

## 2018-12-18 DIAGNOSIS — Z3480 Encounter for supervision of other normal pregnancy, unspecified trimester: Secondary | ICD-10-CM

## 2018-12-18 DIAGNOSIS — Z3A34 34 weeks gestation of pregnancy: Secondary | ICD-10-CM

## 2018-12-18 NOTE — Progress Notes (Signed)
   PRENATAL VISIT NOTE  Subjective:  Joanne Cordova is a 18 y.o. G2P0010 at [redacted]w[redacted]d being seen today for ongoing prenatal care.  She is currently monitored for the following issues for this high-risk pregnancy and has Supervision of normal pregnancy, antepartum; URI (upper respiratory infection); Abnormal glucose tolerance test (GTT) during pregnancy, antepartum; and Gestational diabetes mellitus (GDM) in third trimester on their problem list.  Patient reports no complaints.  Contractions: Not present. Vag. Bleeding: None.  Movement: Present. Denies leaking of fluid.   The following portions of the patient's history were reviewed and updated as appropriate: allergies, current medications, past family history, past medical history, past social history, past surgical history and problem list.   Objective:   Vitals:   12/18/18 1501  BP: 132/74  Pulse: (!) 104  Weight: 172 lb (78 kg)    Fetal Status: Fetal Heart Rate (bpm): 145 Fundal Height: 32 cm Movement: Present     General:  Alert, oriented and cooperative. Patient is in no acute distress.  Skin: Skin is warm and dry. No rash noted.   Cardiovascular: Normal heart rate noted  Respiratory: Normal respiratory effort, no problems with respiration noted  Abdomen: Soft, gravid, appropriate for gestational age.  Pain/Pressure: Absent     Pelvic: Cervical exam deferred        Extremities: Normal range of motion.  Edema: Trace  Mental Status: Normal mood and affect. Normal behavior. Normal judgment and thought content.   Assessment and Plan:  Pregnancy: G2P0010 at [redacted]w[redacted]d 1. Supervision of other normal pregnancy, antepartum - Patient doing well, no complaints  - Anticipatory guidance on upcoming appointments with [facility] at next prenatal visit   2. Diet controlled gestational diabetes mellitus (GDM) in third trimester - Patient diet control, never started Metformin  - Patient brought log into visit today and is doing well on diet with  modifications  - Fasting range from 73-92 with one elevated at 93 - 2hr PP range from 97-120, with 3 elevated at 121, 122 and 126 - Discussed continued diet modifications, patient verbalizes understanding   Preterm labor symptoms and general obstetric precautions including but not limited to vaginal bleeding, contractions, leaking of fluid and fetal movement were reviewed in detail with the patient. Please refer to After Visit Summary for other counseling recommendations.   Return in about 2 weeks (around 01/01/2019) for ROB.  Future Appointments  Date Time Provider Department Center  01/01/2019 11:15 AM Sharyon Cable, CNM [facility] None    Sharyon Cable, CNM

## 2018-12-18 NOTE — Patient Instructions (Signed)

## 2018-12-19 NOTE — Progress Notes (Signed)
Subjective: Joanne Cordova is a G2P0010 at [redacted]w[redacted]d who presents to the [facility] today for ob visit.  She does not have a history of any mental health concerns. She is not currently sexually active. She is currently using no method  for birth control. Patient states best friend and family  as her support system.   BP 132/74   Pulse (!) 104   Wt 172 lb (78 kg)   LMP 04/23/2018 (Approximate)   BMI 28.62 kg/m   Birth Control History:  No prior history  MDM Patient counseled on all options for birth control today including LARC. Patient desires no method  initiated for birth control  Assessment:  18 y.o. female desires no method  for birth control  Plan: No additional plans   Gwyndolyn Saxon, [place] 12/19/2018 4:20 PM

## 2019-01-01 ENCOUNTER — Encounter: Payer: Self-pay | Admitting: Certified Nurse Midwife

## 2019-01-01 ENCOUNTER — Ambulatory Visit (INDEPENDENT_AMBULATORY_CARE_PROVIDER_SITE_OTHER): Payer: Medicaid Other | Admitting: Certified Nurse Midwife

## 2019-01-01 ENCOUNTER — Other Ambulatory Visit: Payer: Self-pay

## 2019-01-01 ENCOUNTER — Other Ambulatory Visit (HOSPITAL_COMMUNITY)
Admission: RE | Admit: 2019-01-01 | Discharge: 2019-01-01 | Disposition: A | Discharge status: Home / Self Care | Payer: Medicaid Other | Source: Ambulatory Visit | Attending: Certified Nurse Midwife | Admitting: Certified Nurse Midwife

## 2019-01-01 VITALS — BP 130/66 | HR 99 | Wt 179.4 lb

## 2019-01-01 DIAGNOSIS — Z3480 Encounter for supervision of other normal pregnancy, unspecified trimester: Secondary | ICD-10-CM

## 2019-01-01 DIAGNOSIS — Z3A36 36 weeks gestation of pregnancy: Secondary | ICD-10-CM

## 2019-01-01 DIAGNOSIS — O24410 Gestational diabetes mellitus in pregnancy, diet controlled: Secondary | ICD-10-CM

## 2019-01-01 LAB — OB RESULTS CONSOLE GC/CHLAMYDIA: Gonorrhea: NEGATIVE

## 2019-01-01 LAB — OB RESULTS CONSOLE GBS: GBS: NEGATIVE

## 2019-01-01 NOTE — Patient Instructions (Signed)
Reasons to go to MAU:  1.  Contractions are  3-4 minutes apart or less, each last 1 minute, these have been going on for 1-2 hours, and you cannot walk or talk during them 2.  You have a large gush of fluid, or a trickle of fluid that will not stop and you have to wear a pad 3.  You have bleeding that is bright red, heavier than spotting--like menstrual bleeding (spotting can be normal in early labor or after a check of your cervix) 4.  You do not feel the baby moving like he/she normally does  

## 2019-01-01 NOTE — Progress Notes (Signed)
   PRENATAL VISIT NOTE  Subjective:  Joanne Cordova is a 18 y.o. G2P0010 at [redacted]w[redacted]d being seen today for ongoing prenatal care.  She is currently monitored for the following issues for this high-risk pregnancy and has Supervision of normal pregnancy, antepartum; URI (upper respiratory infection); Abnormal glucose tolerance test (GTT) during pregnancy, antepartum; and Gestational diabetes mellitus (GDM) in third trimester on their problem list.  Patient reports no complaints.  Contractions: Not present. Vag. Bleeding: None.  Movement: Present. Denies leaking of fluid.   The following portions of the patient's history were reviewed and updated as appropriate: allergies, current medications, past family history, past medical history, past social history, past surgical history and problem list.   Objective:   Vitals:   01/01/19 1125  BP: 130/66  Pulse: 99  Weight: 179 lb 6.4 oz (81.4 kg)    Fetal Status: Fetal Heart Rate (bpm): 132 Fundal Height: 33 cm Movement: Present  Presentation: Vertex  General:  Alert, oriented and cooperative. Patient is in no acute distress.  Skin: Skin is warm and dry. No rash noted.   Cardiovascular: Normal heart rate noted  Respiratory: Normal respiratory effort, no problems with respiration noted  Abdomen: Soft, gravid, appropriate for gestational age.  Pain/Pressure: Absent     Pelvic: Cervical exam performed Dilation: Closed Effacement (%): Thick Station: -3  Extremities: Normal range of motion.  Edema: Trace  Mental Status: Normal mood and affect. Normal behavior. Normal judgment and thought content.   Assessment and Plan:  Pregnancy: G2P0010 at [redacted]w[redacted]d 1. Supervision of other normal pregnancy, antepartum - Patient doing well, no complaints - Anticipatory guidance on upcoming appointments  - Educated and discussed recommendations with COVID19 including hand washing and social distancing, patient verbalizes understanding  - Cervicovaginal ancillary  only( [place]) - Culture, beta strep (group b only)  2. Diet controlled gestational diabetes mellitus (GDM) in third trimester - Patient forgot to bring log to appointment, reports "sugar level has been good" - Encouraged patient to bring log to each and every appointment   Preterm labor symptoms and general obstetric precautions including but not limited to vaginal bleeding, contractions, leaking of fluid and fetal movement were reviewed in detail with the patient. Please refer to After Visit Summary for other counseling recommendations.   Return in about 1 week (around 01/08/2019) for ROB.  Future Appointments  Date Time Provider Department Center  01/09/2019  1:45 PM Sharyon Cable, CNM [facility] None    Sharyon Cable, [place]

## 2019-01-02 LAB — CERVICOVAGINAL ANCILLARY ONLY
Chlamydia: NEGATIVE
Neisseria Gonorrhea: NEGATIVE

## 2019-01-05 LAB — CULTURE, BETA STREP (GROUP B ONLY): Strep Gp B Culture: NEGATIVE

## 2019-01-09 ENCOUNTER — Encounter: Payer: Medicaid Other | Admitting: Certified Nurse Midwife

## 2019-01-09 ENCOUNTER — Other Ambulatory Visit: Payer: Self-pay

## 2019-01-09 ENCOUNTER — Ambulatory Visit (INDEPENDENT_AMBULATORY_CARE_PROVIDER_SITE_OTHER): Payer: Medicaid Other | Admitting: Certified Nurse Midwife

## 2019-01-09 DIAGNOSIS — Z3480 Encounter for supervision of other normal pregnancy, unspecified trimester: Secondary | ICD-10-CM

## 2019-01-09 DIAGNOSIS — O24410 Gestational diabetes mellitus in pregnancy, diet controlled: Secondary | ICD-10-CM

## 2019-01-09 DIAGNOSIS — Z3A37 37 weeks gestation of pregnancy: Secondary | ICD-10-CM

## 2019-01-09 NOTE — Progress Notes (Signed)
   TELEHEALTH VIRTUAL OBSTETRICS VISIT ENCOUNTER NOTE  I connected with Joanne Cordova on 01/09/19 at  1:55 PM EDT by telephone at home and verified that I am speaking with the correct person using two identifiers.   I discussed the limitations, risks, security and privacy concerns of performing an evaluation and management service by telephone and the availability of in person appointments. I also discussed with the patient that there may be a patient responsible charge related to this service. The patient expressed understanding and agreed to proceed.  Subjective:  Joanne Cordova is a 18 y.o. G2P0010 at [redacted]w[redacted]d being followed for ongoing prenatal care.  She is currently monitored for the following issues for this high-risk pregnancy and has Supervision of normal pregnancy, antepartum; URI (upper respiratory infection); Abnormal glucose tolerance test (GTT) during pregnancy, antepartum; and Gestational diabetes mellitus (GDM) in third trimester on their problem list.  Patient reports no complaints. Reports fetal movement. Denies any contractions, bleeding or leaking of fluid.   The following portions of the patient's history were reviewed and updated as appropriate: allergies, current medications, past family history, past medical history, past social history, past surgical history and problem list.   Objective:   General:  Alert, oriented and cooperative.   Mental Status: Normal mood and affect perceived. Normal judgment and thought content.  Rest of physical exam deferred due to type of encounter  Assessment and Plan:  Pregnancy: G2P0010 at [redacted]w[redacted]d 1. Supervision of other normal pregnancy, antepartum - Patient doing well no complaints - Anticipatory guidance on upcoming appointments with telehealth visit then IOL for GDM   2. Diet controlled gestational diabetes mellitus (GDM) in third trimester - GDM good control with diet  - Fasting ranging from 80-86 and 2hr PP ranging from  109-120  - Plan for IOL on 4/15 @ 0700, orders for admission placed   Term labor symptoms and general obstetric precautions including but not limited to vaginal bleeding, contractions, leaking of fluid and fetal movement were reviewed in detail with the patient.  I discussed the assessment and treatment plan with the patient. The patient was provided an opportunity to ask questions and all were answered. The patient agreed with the plan and demonstrated an understanding of the instructions. The patient was advised to call back or seek an in-person office evaluation/go to MAU at [facility] for any urgent or concerning symptoms. Please refer to After Visit Summary for other counseling recommendations.   I provided 7 minutes of non-face-to-face time during this encounter.  Return in about 6 days (around 01/15/2019) for TELEHEALTH- ROB .  Future Appointments  Date Time Provider Department Center  01/15/2019  2:45 PM Sharyon Cable, CNM [facility] None    Sharyon Cable, CNM Center for [employer], [facility]

## 2019-01-13 ENCOUNTER — Telehealth (HOSPITAL_COMMUNITY): Payer: Self-pay | Admitting: *Deleted

## 2019-01-13 NOTE — Telephone Encounter (Signed)
Preadmission screen  

## 2019-01-15 ENCOUNTER — Encounter: Payer: Self-pay | Admitting: Obstetrics

## 2019-01-15 ENCOUNTER — Ambulatory Visit (INDEPENDENT_AMBULATORY_CARE_PROVIDER_SITE_OTHER): Payer: Medicaid Other | Admitting: Obstetrics

## 2019-01-15 ENCOUNTER — Other Ambulatory Visit: Payer: Self-pay

## 2019-01-15 DIAGNOSIS — Z3480 Encounter for supervision of other normal pregnancy, unspecified trimester: Secondary | ICD-10-CM

## 2019-01-15 DIAGNOSIS — Z3A38 38 weeks gestation of pregnancy: Secondary | ICD-10-CM

## 2019-01-15 DIAGNOSIS — O24410 Gestational diabetes mellitus in pregnancy, diet controlled: Secondary | ICD-10-CM

## 2019-01-15 NOTE — Progress Notes (Signed)
   TELEHEALTH VIRTUAL OBSTETRICS VISIT ENCOUNTER NOTE  I connected with Joanne Cordova on 01/15/19 at  2:45 PM EDT by telephone at home and verified that I am speaking with the correct person using two identifiers.   I discussed the limitations, risks, security and privacy concerns of performing an evaluation and management service by telephone and the availability of in person appointments. I also discussed with the patient that there may be a patient responsible charge related to this service. The patient expressed understanding and agreed to proceed.  Subjective:  Joanne Cordova is a 18 y.o. G2P0010 at [redacted]w[redacted]d being followed for ongoing prenatal care.  She is currently monitored for the following issues for this high-risk pregnancy and has Supervision of normal pregnancy, antepartum; URI (upper respiratory infection); Abnormal glucose tolerance test (GTT) during pregnancy, antepartum; and Gestational diabetes mellitus (GDM) in third trimester on their problem list.  Patient reports no complaints. Reports fetal movement. Denies any contractions, bleeding or leaking of fluid.   The following portions of the patient's history were reviewed and updated as appropriate: allergies, current medications, past family history, past medical history, past social history, past surgical history and problem list.   Objective:   General:  Alert, oriented and cooperative.   Mental Status: Normal mood and affect perceived. Normal judgment and thought content.  Rest of physical exam deferred due to type of encounter  Assessment and Plan:  Pregnancy: G2P0010 at [redacted]w[redacted]d 1. Supervision of other normal pregnancy, antepartum  2. Diet controlled gestational diabetes mellitus (GDM) in third trimester.  Good glucose control. - IOL at 39 weeks for GDM  Term labor symptoms and general obstetric precautions including but not limited to vaginal bleeding, contractions, leaking of fluid and fetal movement were  reviewed in detail with the patient.  I discussed the assessment and treatment plan with the patient. The patient was provided an opportunity to ask questions and all were answered. The patient agreed with the plan and demonstrated an understanding of the instructions. The patient was advised to call back or seek an in-person office evaluation/go to [facility]U at [facility] for any urgent or concerning symptoms. Please refer to After Visit Summary for other counseling recommendations.   I provided 9 minutes of non-face-to-face time during this encounter.  Return in about 1 week (around 01/22/2019) for ROB.  Future Appointments  Date Time Provider Department Center  01/22/2019  7:00 AM [facility] SCHED ROOM [facility] None    Coral Ceo, MD Center for [facility], [facility] 01-15-2019

## 2019-01-21 ENCOUNTER — Other Ambulatory Visit (HOSPITAL_COMMUNITY): Payer: Self-pay | Admitting: *Deleted

## 2019-01-22 ENCOUNTER — Other Ambulatory Visit: Payer: Self-pay

## 2019-01-22 ENCOUNTER — Inpatient Hospital Stay (HOSPITAL_COMMUNITY): Payer: Medicaid Other

## 2019-01-22 ENCOUNTER — Inpatient Hospital Stay (HOSPITAL_COMMUNITY)
Admission: AD | Admit: 2019-01-22 | Discharge: 2019-01-25 | DRG: 806 | Disposition: A | Discharge status: Home / Self Care | Payer: Medicaid Other | Attending: Obstetrics & Gynecology | Admitting: Obstetrics & Gynecology

## 2019-01-22 ENCOUNTER — Encounter (HOSPITAL_COMMUNITY): Payer: Self-pay

## 2019-01-22 DIAGNOSIS — Z9189 Other specified personal risk factors, not elsewhere classified: Secondary | ICD-10-CM

## 2019-01-22 DIAGNOSIS — Z8759 Personal history of other complications of pregnancy, childbirth and the puerperium: Secondary | ICD-10-CM

## 2019-01-22 DIAGNOSIS — O24410 Gestational diabetes mellitus in pregnancy, diet controlled: Secondary | ICD-10-CM

## 2019-01-22 DIAGNOSIS — O24420 Gestational diabetes mellitus in childbirth, diet controlled: Secondary | ICD-10-CM | POA: Diagnosis present

## 2019-01-22 DIAGNOSIS — Z3A39 39 weeks gestation of pregnancy: Secondary | ICD-10-CM | POA: Diagnosis not present

## 2019-01-22 DIAGNOSIS — O24419 Gestational diabetes mellitus in pregnancy, unspecified control: Secondary | ICD-10-CM | POA: Diagnosis present

## 2019-01-22 DIAGNOSIS — Z3480 Encounter for supervision of other normal pregnancy, unspecified trimester: Secondary | ICD-10-CM

## 2019-01-22 LAB — TYPE AND SCREEN
ABO/RH(D): O POS
Antibody Screen: NEGATIVE

## 2019-01-22 LAB — CBC
HCT: 34.1 % — ABNORMAL LOW (ref 36.0–46.0)
Hemoglobin: 10.6 g/dL — ABNORMAL LOW (ref 12.0–15.0)
MCH: 25.2 pg — ABNORMAL LOW (ref 26.0–34.0)
MCHC: 31.1 g/dL (ref 30.0–36.0)
MCV: 81.2 fL (ref 80.0–100.0)
Platelets: 184 10*3/uL (ref 150–400)
RBC: 4.2 MIL/uL (ref 3.87–5.11)
RDW: 14.1 % (ref 11.5–15.5)
WBC: 7.4 10*3/uL (ref 4.0–10.5)
nRBC: 0 % (ref 0.0–0.2)

## 2019-01-22 LAB — GLUCOSE, CAPILLARY
Glucose-Capillary: 80 mg/dL (ref 70–99)
Glucose-Capillary: 76 mg/dL (ref 70–99)
Glucose-Capillary: 77 mg/dL (ref 70–99)
Glucose-Capillary: 83 mg/dL (ref 70–99)

## 2019-01-22 LAB — ABO/RH: ABO/RH(D): O POS

## 2019-01-22 MED ORDER — PHENYLEPHRINE 40 MCG/ML (10ML) SYRINGE FOR IV PUSH (FOR BLOOD PRESSURE SUPPORT): 80.0000 ug | PREFILLED_SYRINGE | INTRAVENOUS | Status: DC | PRN

## 2019-01-22 MED ORDER — DIPHENHYDRAMINE HCL 50 MG/ML IJ SOLN: 12.5000 mg | INTRAMUSCULAR | Status: DC | PRN

## 2019-01-22 MED ORDER — MISOPROSTOL 50MCG HALF TABLET
50.0000 ug | ORAL_TABLET | ORAL | Status: DC | PRN
  Administered 2019-01-22: 09:00:00 50 ug via BUCCAL
  Filled 2019-01-22: qty 1

## 2019-01-22 MED ORDER — LIDOCAINE HCL (PF) 1 % IJ SOLN
30.0000 mL | INTRAMUSCULAR | Status: DC | PRN
  Filled 2019-01-22: qty 30

## 2019-01-22 MED ORDER — SOD CITRATE-CITRIC ACID 500-334 MG/5ML PO SOLN: 30.0000 mL | ORAL | Status: DC | PRN

## 2019-01-22 MED ORDER — EPHEDRINE 5 MG/ML INJ: 10.0000 mg | INTRAVENOUS | Status: DC | PRN

## 2019-01-22 MED ORDER — ZOLPIDEM TARTRATE 5 MG PO TABS
5.0000 mg | ORAL_TABLET | Freq: Every evening | ORAL | Status: DC | PRN
  Administered 2019-01-22: 23:00:00 5 mg via ORAL
  Filled 2019-01-22: qty 1

## 2019-01-22 MED ORDER — OXYTOCIN 40 UNITS IN NORMAL SALINE INFUSION - SIMPLE MED: 2.5000 [IU]/h | INTRAVENOUS | Status: DC

## 2019-01-22 MED ORDER — ACETAMINOPHEN 325 MG PO TABS
650.0000 mg | ORAL_TABLET | ORAL | Status: DC | PRN
  Administered 2019-01-23: 650 mg via ORAL
  Filled 2019-01-22: qty 2

## 2019-01-22 MED ORDER — FENTANYL-BUPIVACAINE-NACL 0.5-0.125-0.9 MG/250ML-% EP SOLN: 12.0000 mL/h | EPIDURAL | Status: DC | PRN

## 2019-01-22 MED ORDER — MISOPROSTOL 25 MCG QUARTER TABLET
25.0000 ug | ORAL_TABLET | ORAL | Status: DC | PRN
  Administered 2019-01-22: 14:00:00 25 ug via VAGINAL
  Filled 2019-01-22: qty 1

## 2019-01-22 MED ORDER — MISOPROSTOL 25 MCG QUARTER TABLET
25.0000 ug | ORAL_TABLET | ORAL | Status: DC | PRN
  Filled 2019-01-22: qty 1

## 2019-01-22 MED ORDER — OXYCODONE-ACETAMINOPHEN 5-325 MG PO TABS: 1.0000 | ORAL_TABLET | ORAL | Status: DC | PRN

## 2019-01-22 MED ORDER — ONDANSETRON HCL 4 MG/2ML IJ SOLN: 4.0000 mg | Freq: Four times a day (QID) | INTRAMUSCULAR | Status: DC | PRN

## 2019-01-22 MED ORDER — LACTATED RINGERS IV SOLN
INTRAVENOUS | Status: DC
  Administered 2019-01-22 (×2): via INTRAVENOUS

## 2019-01-22 MED ORDER — OXYTOCIN BOLUS FROM INFUSION
500.0000 mL | Freq: Once | INTRAVENOUS | Status: AC
  Administered 2019-01-23: 12:00:00 500 mL via INTRAVENOUS

## 2019-01-22 MED ORDER — FENTANYL CITRATE (PF) 100 MCG/2ML IJ SOLN
100.0000 ug | INTRAMUSCULAR | Status: DC | PRN
  Administered 2019-01-22: 100 ug via INTRAVENOUS
  Filled 2019-01-22: qty 2

## 2019-01-22 MED ORDER — FENTANYL-BUPIVACAINE-NACL 0.5-0.125-0.9 MG/250ML-% EP SOLN
EPIDURAL | Status: AC
  Filled 2019-01-22: qty 250

## 2019-01-22 MED ORDER — OXYCODONE-ACETAMINOPHEN 5-325 MG PO TABS: 2.0000 | ORAL_TABLET | ORAL | Status: DC | PRN

## 2019-01-22 MED ORDER — LACTATED RINGERS IV SOLN
500.0000 mL | INTRAVENOUS | Status: DC | PRN
  Administered 2019-01-23: 10:00:00 500 mL via INTRAVENOUS

## 2019-01-22 MED ORDER — LACTATED RINGERS IV SOLN
500.0000 mL | Freq: Once | INTRAVENOUS | Status: AC
  Administered 2019-01-23: 500 mL via INTRAVENOUS

## 2019-01-22 MED ORDER — TERBUTALINE SULFATE 1 MG/ML IJ SOLN: 0.2500 mg | Freq: Once | INTRAMUSCULAR | Status: DC | PRN

## 2019-01-22 NOTE — Progress Notes (Signed)
LABOR PROGRESS NOTE  Joanne Cordova is a 18 y.o. G2P0010 at [redacted]w[redacted]d  admitted for IOL for GDM  Subjective: Patient doing well, reports increased contractions over the past 3 hours, does not want pain medication at this time but might want soon  Objective: BP 120/67   Pulse (!) 109   Temp 98.1 F (36.7 C) (Oral)   Resp 16   Ht 5\' 5"  (1.651 m)   Wt 83 kg   LMP 04/23/2018 (Approximate)   SpO2 96%   BMI 30.45 kg/m  or  Vitals:   01/22/19 1644 01/22/19 1645 01/22/19 1720 01/22/19 1805  BP:  (!) 152/71  120/67  Pulse:  (!) 118  (!) 109  Resp:  17 16   Temp: 98.1 F (36.7 C)     TempSrc: Oral     SpO2: 99%   96%  Weight:      Height:        FB placed @1900  Dilation: 1.5 Effacement (%): 50 Cervical Position: Posterior Station: -2 Presentation: Vertex Exam by:: Lanice Shirts CNM FHT: baseline rate 135, moderate varibility, +accel, variable decel Toco: 2-3  Labs: Lab Results  Component Value Date   WBC 7.4 01/22/2019   HGB 10.6 (L) 01/22/2019   HCT 34.1 (L) 01/22/2019   MCV 81.2 01/22/2019   PLT 184 01/22/2019    Patient Active Problem List   Diagnosis Date Noted  . GDM (gestational diabetes mellitus) 01/22/2019  . Gestational diabetes mellitus (GDM) in third trimester 12/18/2018  . Abnormal glucose tolerance test (GTT) during pregnancy, antepartum 11/13/2018  . URI (upper respiratory infection) 11/07/2018  . Supervision of normal pregnancy, antepartum 07/19/2018    Assessment / Plan: 18 y.o. G2P0010 at [redacted]w[redacted]d here for IOL for GDM  Labor: FB placed @ 1900, once contractions space will place 3rd cytotec  Fetal Wellbeing:  Cat II  Pain Control:  Pain medication ordered PRN  Anticipated MOD:  SVD  Sharyon Cable, CNM 01/22/2019, 7:03 PM

## 2019-01-22 NOTE — Progress Notes (Signed)
LABOR PROGRESS NOTE  Joanne Cordova is a 18 y.o. G2P0010 at [redacted]w[redacted]d  admitted for IOL fro A1GDM  Subjective: Patient doing well, reports occasional cramping but denies contractions  Objective: BP 137/86 (BP Location: Left Arm)   Pulse 96   Temp 98.3 F (36.8 C) (Oral)   Resp 17   Ht 5\' 5"  (1.651 m)   Wt 83 kg   LMP 04/23/2018 (Approximate)   SpO2 99%   BMI 30.45 kg/m  or  Vitals:   01/22/19 1238 01/22/19 1242 01/22/19 1404 01/22/19 1406  BP: 128/66  137/86   Pulse: (!) 101  96   Resp: 16  17   Temp: 98.3 F (36.8 C)     TempSrc: Oral     SpO2: 98%   99%  Weight:  83 kg    Height:  5\' 5"  (1.651 m)     Vaginal cytotec placed by CNM due to 2-3 minutes contractions  Dilation: Closed Effacement (%): Thick Cervical Position: Posterior Station: -3 Presentation: Vertex Exam by:: Lynda Rainwater RN  FHT: baseline rate 130, moderate varibility, +accel, no decel Toco: 2-3  Labs: Lab Results  Component Value Date   WBC 7.4 01/22/2019   HGB 10.6 (L) 01/22/2019   HCT 34.1 (L) 01/22/2019   MCV 81.2 01/22/2019   PLT 184 01/22/2019    Patient Active Problem List   Diagnosis Date Noted  . GDM (gestational diabetes mellitus) 01/22/2019  . Gestational diabetes mellitus (GDM) in third trimester 12/18/2018  . Abnormal glucose tolerance test (GTT) during pregnancy, antepartum 11/13/2018  . URI (upper respiratory infection) 11/07/2018  . Supervision of normal pregnancy, antepartum 07/19/2018    Assessment / Plan: 19 y.o. G2P0010 at [redacted]w[redacted]d here for IOL for GDM   Labor: IOL with cytotec, plan to place FB when dilated  Fetal Wellbeing:  Cat I  Pain Control:  Pain medication ordered PRN  Anticipated MOD:  SVD  Sharyon Cable, CNM 01/22/2019, 2:33 PM

## 2019-01-22 NOTE — H&P (Addendum)
LABOR AND DELIVERY ADMISSION HISTORY AND PHYSICAL NOTE  Joanne Cordova is a 18 y.o. female G2P0010 with IUP at 23w0dby 6 weeks [place] for [place]  She reports positive fetal movement. She denies leakage of fluid or vaginal bleeding.  [facility]  - [facility]   Past Medical History: Past Medical History:  Diagnosis Date  . Diabetes mellitus without complication ([place]   . Diet controlled gestational diabetes mellitus (GDM) in third trimester 11/21/2018   Guidelines for Antenatal Testing and Sonography  (with updated ICD-10 codes)  Updated  9September 19, 2019with Dr. RTama High INDICATION U/S 2 X week NST/AFI  or full BPP wkly DELIVERY Diabetes   A1 - good control - O24.410    A2 - good control - O24.419      A2  - poor control or poor compliance - O24.419, E11.65   (Macrosomia or polyhydramnios) **E11.65 is extra code for poor control**    A2/B - O24.91  . Gestational diabetes   . Hx of chlamydia infection 2018  . Hx of gonorrhea 2018    Past Surgical History: Past Surgical History:  Procedure Laterality Date  . NO PAST SURGERIES      Obstetrical History: OB History    Gravida  2   Para      Term      Preterm      AB  1   Living        SAB  1   TAB      Ectopic      Multiple      Live Births              Social History: Social History   Socioeconomic History  . Marital status: Single    Spouse name: Not on file  . Number of children: Not on file  . Years of education: Not on file  . Highest education level: Not on file  Occupational History  . Not on file  Social Needs  . Financial resource strain: Not on file  . Food insecurity:    Worry: Not on file    Inability: Not on file  . Transportation needs:    Medical: Not on file    Non-medical: Not on file  Tobacco Use  . Smoking status: Never Smoker  . Smokeless tobacco: Never Used  Substance and Sexual Activity   . Alcohol use: Not Currently    Comment: socially  . Drug use: Not Currently    Types: Marijuana  . Sexual activity: Not Currently    Birth control/protection: None  Lifestyle  . Physical activity:    Days per week: Not on file    Minutes per session: Not on file  . Stress: Not on file  Relationships  . Social connections:    Talks on phone: Not on file    Gets together: Not on file    Attends religious service: Not on file    Active member of club or organization: Not on file    Attends meetings of clubs or organizations: Not on file    Relationship status: Not on file  Other Topics Concern  . Not on file  Social History Narrative  . Not on file    Family History: Family History  Problem Relation Age of Onset  . Cervical cancer Mother   . Diabetes Maternal Grandmother     Allergies: No Known Allergies  Medications Prior to Admission  Medication Sig Dispense Refill Last Dose  . ACCU-CHEK FASTCLIX LANCETS MISC 1 Units by Percutaneous route 4 (four) times daily. 100 each 12 Taking  . acetaminophen (TYLENOL) 500 MG tablet Take 2 tablets (1,000 mg total) by mouth every 6 (six) hours as needed for moderate pain. 30 tablet 2 Taking  . Blood Glucose Monitoring Suppl w/Device KIT Check 4 times a day 1 each 0 Taking  . docusate sodium (COLACE) 250 MG capsule Take 1 capsule (250 mg total) by mouth daily. 10 capsule 0 Taking  . glucose blood test strip Use as instructed 100 each 12 Taking  . Prenatal MV & Min w/FA-DHA (PRENATAL ADULT GUMMY/DHA/FA) 0.4-25 MG CHEW Chew 2 tablets by mouth daily. 30 tablet 6 Taking     Review of Systems  All systems reviewed and negative except as stated in HPI  Physical Exam Blood pressure 127/80, pulse (!) 103, temperature 98.1 F (36.7 C), resp. rate 16, last menstrual period 04/23/2018, unknown if currently breastfeeding. General appearance: alert, cooperative and no distress Lungs: clear to auscultation bilaterally Heart: regular rate  and rhythm Abdomen: soft, non-tender; bowel sounds normal Extremities: No calf swelling or tenderness Presentation: cephalic Fetal monitoring: 135/ moderate/ +accels/ no decelerations  Uterine activity: UI     Prenatal labs: ABO, Rh: O/Positive/-- (10/11 1154) Antibody: Negative (10/11 1154) Rubella: 6.79 (10/11 1154) RPR: Non Reactive (01/30 1045)  HBsAg: Negative (10/11 1154)  HIV: Non Reactive (01/30 1045)  GC/Chlamydia: Negative GBS:   Negative 2 hr Glucola: 95-144-81 Genetic screening:  negative Anatomy US: normal   Nursing Staff Provider  Office Location  [facility] Dating   6 week sono  Language  English Anatomy US   ordered for 09/04/18  Flu Vaccine  Declined  Genetic Screen  NIPS: low risks  AFP:  declined     TDaP vaccine  Declined 11/21/18 Hgb A1C or  GTT Third trimester: 95-144-81  Rhogam  n/a   LAB RESULTS   Feeding Plan Both  Blood Type O/Positive/-- (10/11 1154)   Contraception Undecided  Antibody Negative (10/11 1154)  Circumcision Yes  Rubella 6.79 (10/11 1154)  Pediatrician  [place]  RPR Non Reactive (01/30 1045)   Support Person Boyfriend's mom, FOB, and Latasha  HBsAg Negative (10/11 1154)   Prenatal Classes No  HIV Non Reactive (01/30 1045)  BTL Consent NA GBS  (For PCN allergy, check sensitivities)   VBAC Consent NA Pap n/a    Hgb Electro  AA    CF negative    SMA  2 copies    Waterbirth  '[ ]'  Class '[ ]'  Consent '[ ]'  CNM visit    Prenatal Transfer Tool  Maternal Diabetes: Yes:  Diabetes Type:  Diet controlled Genetic Screening: Normal Maternal Ultrasounds/Referrals: Normal Fetal Ultrasounds or other Referrals:  None Maternal Substance Abuse:  No Significant Maternal Medications:  None Significant Maternal Lab Results: Lab values include: Group B Strep negative  No results found for this or any previous visit (from the past 24 hour(s)).  Patient Active Problem List   Diagnosis Date Noted  . GDM (gestational diabetes mellitus) 01/22/2019  .  Gestational diabetes mellitus (GDM) in third trimester 12/18/2018  . Abnormal glucose tolerance test (GTT) during pregnancy, antepartum 11/13/2018  . URI (upper respiratory infection) 11/07/2018  . Supervision of normal pregnancy, antepartum 07/19/2018    Assessment: Joanne Cordova is a 18 y.o. G2P0010 at 12w0dhere for IOL for GDMA1   #Labor: IOL with cytotec  #Pain: Pain medication ordered PRN,  patient is unsure if she wants an epidural at this time  #FWB: Cat I  #ID:  GBS negative  #MOF: Breast and bottle  #MOC:Unsure  #Circ:  Yes outpatient   Lajean Manes, CNM 01/22/2019, 9:05 AM

## 2019-01-22 NOTE — MAU Note (Signed)
Pt scheduled for induction for GDM, awaiting for L&D bed to be cleaned

## 2019-01-22 NOTE — Progress Notes (Signed)
Patient ID: Joanne Cordova, female   DOB: [birth year], 18 y.o.   MRN: [id number]  Pt with cervical foley still in place; has had dinner and been in the shower; feeling a lot of cramping- cytotec held   BP 121/57, P 106 FHR 135-140s, +accels, Cat 1, occ variables Ctx irreg Cx deferred  CBG 83, and all <100  IUP@39 .0wks A1GDM Cx unfavorable GBS neg  -Plan to leave cervical foley in until it comes out, then plan for Pit -Rx Ambien for sleep -Will get CBG q shift  Arabella Merles CNM 01/22/2019 10:30 PM

## 2019-01-23 ENCOUNTER — Inpatient Hospital Stay (HOSPITAL_COMMUNITY): Payer: Medicaid Other | Admitting: Anesthesiology

## 2019-01-23 ENCOUNTER — Encounter (HOSPITAL_COMMUNITY): Payer: Self-pay | Admitting: *Deleted

## 2019-01-23 DIAGNOSIS — Z3A39 39 weeks gestation of pregnancy: Secondary | ICD-10-CM

## 2019-01-23 DIAGNOSIS — O24420 Gestational diabetes mellitus in childbirth, diet controlled: Secondary | ICD-10-CM

## 2019-01-23 LAB — GLUCOSE, CAPILLARY: Glucose-Capillary: 110 mg/dL — ABNORMAL HIGH (ref 70–99)

## 2019-01-23 MED ORDER — TETANUS-DIPHTH-ACELL PERTUSSIS 5-2.5-18.5 LF-MCG/0.5 IM SUSP: 0.5000 mL | Freq: Once | INTRAMUSCULAR | Status: DC

## 2019-01-23 MED ORDER — WITCH HAZEL-GLYCERIN EX PADS: 1.0000 "application " | MEDICATED_PAD | CUTANEOUS | Status: DC | PRN

## 2019-01-23 MED ORDER — ETONOGESTREL 68 MG ~~LOC~~ IMPL
68.0000 mg | DRUG_IMPLANT | Freq: Once | SUBCUTANEOUS | Status: DC
  Filled 2019-01-23: qty 1

## 2019-01-23 MED ORDER — ONDANSETRON HCL 4 MG PO TABS: 4.0000 mg | ORAL_TABLET | ORAL | Status: DC | PRN

## 2019-01-23 MED ORDER — OXYCODONE HCL 5 MG PO TABS: 10.0000 mg | ORAL_TABLET | ORAL | Status: DC | PRN

## 2019-01-23 MED ORDER — LIDOCAINE-EPINEPHRINE (PF) 2 %-1:200000 IJ SOLN
INTRAMUSCULAR | Status: DC | PRN
  Administered 2019-01-23: 5 mL via EPIDURAL

## 2019-01-23 MED ORDER — SODIUM CHLORIDE (PF) 0.9 % IJ SOLN
INTRAMUSCULAR | Status: DC | PRN
  Administered 2019-01-23: 14 mL/h via EPIDURAL

## 2019-01-23 MED ORDER — ACETAMINOPHEN 325 MG PO TABS
650.0000 mg | ORAL_TABLET | ORAL | Status: DC | PRN
  Administered 2019-01-23: 650 mg via ORAL
  Filled 2019-01-23: qty 2

## 2019-01-23 MED ORDER — SENNOSIDES-DOCUSATE SODIUM 8.6-50 MG PO TABS
2.0000 | ORAL_TABLET | ORAL | Status: DC
  Administered 2019-01-24: 2 via ORAL
  Filled 2019-01-23: qty 2

## 2019-01-23 MED ORDER — SIMETHICONE 80 MG PO CHEW: 80.0000 mg | CHEWABLE_TABLET | ORAL | Status: DC | PRN

## 2019-01-23 MED ORDER — DOCUSATE SODIUM 100 MG PO CAPS
100.0000 mg | ORAL_CAPSULE | Freq: Two times a day (BID) | ORAL | Status: DC
  Administered 2019-01-23 – 2019-01-25 (×4): 100 mg via ORAL
  Filled 2019-01-23 (×4): qty 1

## 2019-01-23 MED ORDER — IBUPROFEN 600 MG PO TABS
600.0000 mg | ORAL_TABLET | Freq: Four times a day (QID) | ORAL | Status: DC
  Administered 2019-01-23 – 2019-01-25 (×8): 600 mg via ORAL
  Filled 2019-01-23 (×8): qty 1

## 2019-01-23 MED ORDER — OXYCODONE HCL 5 MG PO TABS: 5.0000 mg | ORAL_TABLET | ORAL | Status: DC | PRN

## 2019-01-23 MED ORDER — ONDANSETRON HCL 4 MG/2ML IJ SOLN: 4.0000 mg | INTRAMUSCULAR | Status: DC | PRN

## 2019-01-23 MED ORDER — MEASLES, MUMPS & RUBELLA VAC IJ SOLR: 0.5000 mL | Freq: Once | INTRAMUSCULAR | Status: DC

## 2019-01-23 MED ORDER — LACTATED RINGERS IV SOLN: INTRAVENOUS | Status: DC

## 2019-01-23 MED ORDER — ZOLPIDEM TARTRATE 5 MG PO TABS: 5.0000 mg | ORAL_TABLET | Freq: Every evening | ORAL | Status: DC | PRN

## 2019-01-23 MED ORDER — DIBUCAINE (PERIANAL) 1 % EX OINT: 1.0000 "application " | TOPICAL_OINTMENT | CUTANEOUS | Status: DC | PRN

## 2019-01-23 MED ORDER — OXYTOCIN 40 UNITS IN NORMAL SALINE INFUSION - SIMPLE MED
INTRAVENOUS | Status: AC
  Administered 2019-01-23: 08:00:00 via INTRAVENOUS
  Filled 2019-01-23: qty 1000

## 2019-01-23 MED ORDER — LIDOCAINE HCL 1 % IJ SOLN
0.0000 mL | Freq: Once | INTRAMUSCULAR | Status: DC | PRN
  Filled 2019-01-23: qty 20

## 2019-01-23 MED ORDER — COCONUT OIL OIL: 1.0000 "application " | TOPICAL_OIL | Status: DC | PRN

## 2019-01-23 MED ORDER — DIPHENHYDRAMINE HCL 25 MG PO CAPS: 25.0000 mg | ORAL_CAPSULE | Freq: Four times a day (QID) | ORAL | Status: DC | PRN

## 2019-01-23 MED ORDER — PRENATAL MULTIVITAMIN CH
1.0000 | ORAL_TABLET | Freq: Every day | ORAL | Status: DC
  Administered 2019-01-24 – 2019-01-25 (×2): 1 via ORAL
  Filled 2019-01-23 (×2): qty 1

## 2019-01-23 MED ORDER — BENZOCAINE-MENTHOL 20-0.5 % EX AERO
1.0000 "application " | INHALATION_SPRAY | CUTANEOUS | Status: DC | PRN
  Administered 2019-01-23 – 2019-01-25 (×3): 1 via TOPICAL
  Filled 2019-01-23 (×3): qty 56

## 2019-01-23 NOTE — Discharge Summary (Signed)
Postpartum Discharge Summary     Patient Name: Joanne Cordova DOB: [birth year] MRN: [id number]  Date of admission: 01/22/2019 Delivering Provider: Verita Schneiders A   Date of discharge: 01/25/2019  Admitting diagnosis: INDUCTION Intrauterine pregnancy: [redacted]w[redacted]d    Secondary diagnosis:  Principal Problem:   Status post vacuum-assisted vaginal delivery Active Problems:   Gestational diabetes mellitus (GDM) in third trimester  Additional problems: none     Discharge diagnosis: Term Pregnancy Delivered and GDM A1                                                                                                Post partum procedures: None  Augmentation: AROM, Pitocin, Cytotec and Foley Balloon  Complications: None  Hospital course:  Induction of Labor With Vaginal Delivery   18y.o. yo G2P0010 at 342w1das admitted to the hospital 01/22/2019 for induction of labor.  Indication for induction: A1 DM.  Patient had an uncomplicated labor course as follows: Membrane Rupture Time/Date: 10:30 PM ,01/22/2019   Intrapartum Procedures: Episiotomy: None [1]                                         Lacerations:  2nd degree [3];Perineal [11];Labial [10]  Patient had delivery of a Viable infant.  Information for the patient's newborn:  MaAlfreida, [place]0[[id number]]Delivery Method: Vaginal, Vacuum (Extractor)(Filed from Delivery Summary)   01/23/2019  Details of delivery can be found in separate delivery note.  Patient had a routine postpartum course. Patient is discharged home 01/25/19.  Magnesium Sulfate recieved: No BMZ received: No  Physical exam  Vitals:   01/24/19 0628 01/24/19 1401 01/24/19 2236 01/25/19 0525  BP: 114/60 130/77 127/77 100/77  Pulse: 94 98 98 92  Resp: '20 18 18 16  ' Temp: 98.4 F (36.9 C) 97.8 F (36.6 C) 98.1 F (36.7 C) 98.1 F (36.7 C)  TempSrc: Oral Oral Oral   SpO2:  100%  98%  Weight:      Height:       General: alert, cooperative and no  distress Lochia: appropriate Uterine Fundus: firm Incision: N/A DVT Evaluation: No evidence of DVT seen on physical exam. Labs: Lab Results  Component Value Date   WBC 7.4 01/22/2019   HGB 10.6 (L) 01/22/2019   HCT 34.1 (L) 01/22/2019   MCV 81.2 01/22/2019   PLT 184 01/22/2019   CMP Latest Ref Rng & Units 10/29/2018  Glucose 70 - 99 mg/dL 79  BUN 6 - 20 mg/dL <5(L)  Creatinine 0.44 - 1.00 mg/dL 0.40(L)  Sodium 135 - 145 mmol/L 133(L)  Potassium 3.5 - 5.1 mmol/L 3.2(L)  Chloride 98 - 111 mmol/L 104  CO2 22 - 32 mmol/L 22  Calcium 8.9 - 10.3 mg/dL 8.0(L)  Total Protein 6.5 - 8.1 g/dL 6.4(L)  Total Bilirubin 0.3 - 1.2 mg/dL 0.7  Alkaline Phos 38 - 126 U/L 108  AST 15 - 41 U/L 19  ALT 0 - 44 U/L 11  Discharge instruction: per After Visit Summary and "Baby and Me Booklet".  After visit meds:  Allergies as of 01/25/2019   No Known Allergies     Medication List    STOP taking these medications   Accu-Chek FastClix Lancets Misc   Blood Glucose Monitoring Suppl w/Device Kit   glucose blood test strip     TAKE these medications   acetaminophen 500 MG tablet Commonly known as:  TYLENOL Take 2 tablets (1,000 mg total) by mouth every 6 (six) hours as needed for moderate pain.   docusate sodium 250 MG capsule Commonly known as:  COLACE Take 1 capsule (250 mg total) by mouth daily.   ibuprofen 600 MG tablet Commonly known as:  ADVIL Take 1 tablet (600 mg total) by mouth every 6 (six) hours.   Prenatal Adult Gummy/DHA/FA 0.4-25 MG Chew Chew 2 tablets by mouth daily.       Diet: routine diet  Activity: Advance as tolerated. Pelvic rest for 6 weeks.   Outpatient follow up:6 weeks Follow up Appt: Future Appointments  Date Time Provider [place]  02/06/2019  1:45 PM Wende Mott, CNM [place] None  03/06/2019  8:30 AM [facility] None  03/06/2019  9:15 AM Lajean Manes, CNM [facility] None   Follow up Visit:   Please schedule this  patient for Postpartum visit in: 6 weeks with the following provider: APP For C/S patients schedule nurse incision check in weeks 2 weeks: no Low risk pregnancy complicated by: GDM Delivery mode:  Vacuum Anticipated Birth Control:  Possible Nexplanon postpartum PP Procedures needed: 2 hour GTT  Schedule Integrated BH visit: no   Newborn Data: Live born female  Birth Weight: 7 lb 15 oz (3600 g) APGAR: 7, 9  Newborn Delivery   Birth date/time:  01/23/2019 11:32:00 Delivery type:  Vaginal, Vacuum (Extractor)     Baby Feeding: Bottle and Breast Disposition:home with mother   01/25/2019 Aura Camps, MD

## 2019-01-23 NOTE — Progress Notes (Signed)
Patient ID: Joanne Cordova, female   DOB: Mar 05, 2001, 18 y.o.   MRN: [id number]  Peeked in room; pt with epidural and appears to be resting- not disturbed; s/p cervical foley and SROM at 2230  BP 113/43, P 95 FHR 130s, +accels, Cat 1 Ctx q 3 mins Cx was 5/80/-1 per RN exam @ 0047  IUP@term  GDMA1 Early active labor  Continue with induction; can add Pit if ctx space out and cx doesn't progress  Arabella Merles [facility] 01/23/2019

## 2019-01-23 NOTE — Progress Notes (Addendum)
This RN went in to inform pt that the CNM would be around to insert her Nexplanon. Per pt she does not want the Nexplanon. This RN asked pt if she wanted any form of birth control before leaving the hospital; pt states "no I will talk to [place] (CNM) about it at my next checkup." This RN informed Dr. Aneta Mins of pt's request.

## 2019-01-23 NOTE — Anesthesia Procedure Notes (Signed)
Epidural Patient location during procedure: OB Start time: 01/23/2019 12:17 AM End time: 01/23/2019 12:29 AM  Staffing Anesthesiologist: Lucretia Kern, MD Performed: anesthesiologist   Preanesthetic Checklist Completed: patient identified, pre-op evaluation, timeout performed, IV checked, risks and benefits discussed and monitors and equipment checked  Epidural Patient position: sitting Prep: DuraPrep Patient monitoring: heart rate, continuous pulse ox and blood pressure Approach: midline Location: L3-L4 Injection technique: LOR air  Needle:  Needle type: Tuohy  Needle gauge: 17 G Needle length: 9 cm Needle insertion depth: 5.5 cm Catheter type: closed end flexible Catheter size: 19 Gauge Catheter at skin depth: 10.5 cm Test dose: negative and 2% lidocaine with Epi 1:200 K  Assessment Events: blood not aspirated, injection not painful, no injection resistance, negative IV test and no paresthesia  Additional Notes Reason for block:procedure for pain

## 2019-01-23 NOTE — Progress Notes (Signed)
Faculty Practice OB/GYN Attending Note  Called to evaluate patient with prolonged second stage of labor, maternal exhaustion after pushing for over 2 hours.  FHR tracing reassuring in the 150s, +moderate variability, +acclerations but deep decelerations during pushing.  Cervical exam: 10/100/+3 +fetal head moulding.  Patient counseled about continuing to pushing vs operative vaginal delivery. Risks of vacuum assistance were discussed in detail, including but not limited to, bleeding, infection, damage to maternal tissues, fetal cephalohematoma, inability to effect vaginal delivery of the head or shoulder dystocia that cannot be resolved by established maneuvers and need for emergency cesarean section.  Patient gave verbal consent and desires VAVD.  Will proceed with VAVD now.   Jaynie Collins, MD, FACOG Obstetrician & Gynecologist, [facility] for [employer], [facility]

## 2019-01-23 NOTE — Progress Notes (Signed)
LABOR PROGRESS NOTE  Joanne Cordova is a 18 y.o. G2P0010 at [redacted]w[redacted]d  admitted for IOL for A1GDM  Subjective: Patient comfortable with epidural, pushing with contractions   Objective: BP (!) 120/56   Pulse (!) 106   Temp 99.3 F (37.4 C) (Axillary)   Resp 18   Ht 5\' 5"  (1.651 m)   Wt 83 kg   LMP 04/23/2018 (Approximate)   SpO2 96%   BMI 30.45 kg/m  or  Vitals:   01/23/19 0639 01/23/19 0702 01/23/19 0801 01/23/19 0823  BP: 129/78 (!) 115/49 (!) 120/56   Pulse:  (!) 113 (!) 106   Resp:   18 18  Temp:   98.1 F (36.7 C) 99.3 F (37.4 C)  TempSrc:   Oral Axillary  SpO2:      Weight:      Height:        Pushing with contractions @ 0802, with FHR becoming tachycardia and late decelerations occurring, pushing stopped after 15 minutes  Dilation: 10 Dilation Complete Date: 01/23/19 Dilation Complete Time: 0600 Effacement (%): 100 Cervical Position: Posterior Station: Plus 2 Presentation: Vertex Exam by:: R. Craft, RN FHT: baseline rate 170, moderate varibility, no accel, variable and late decel Toco: 4  Labs: Lab Results  Component Value Date   WBC 7.4 01/22/2019   HGB 10.6 (L) 01/22/2019   HCT 34.1 (L) 01/22/2019   MCV 81.2 01/22/2019   PLT 184 01/22/2019    Patient Active Problem List   Diagnosis Date Noted  . GDM (gestational diabetes mellitus) 01/22/2019  . Gestational diabetes mellitus (GDM) in third trimester 12/18/2018  . Abnormal glucose tolerance test (GTT) during pregnancy, antepartum 11/13/2018  . URI (upper respiratory infection) 11/07/2018  . Supervision of normal pregnancy, antepartum 07/19/2018    Assessment / Plan: 18 y.o. G2P0010 at [redacted]w[redacted]d here for IOL for A1GDM  Labor: Laboring down with peanut on left side, pushing paused due to FHR decelerations and tachycardia, Tylenol given for Maternal temp of 99.3 Fetal Wellbeing:  Cat II  Pain Control:  Epidural  Anticipated MOD:  SVD  Sharyon Cable, CNM 01/23/2019, 8:47 AM

## 2019-01-23 NOTE — Progress Notes (Signed)
Patient ID: AVEONA GREENLIEF, female   DOB: 10/15/2000, 18 y.o.   MRN: [id number]  Comfortable with epidural; laboring spontaneously after cytotec/cervical foley/SROM; not feeling pressure  BP 122/46, other VSS FHR 140s, +accels, early variables Ctx q 4-5 mins, spont Cx per RN 9/90/0  IUP@term  Active labor/transition GDMA1  Expectant management Plan to labor down once complete if still not feeling pressure Anticipate SVD  Arabella Merles [facility] 01/23/2019

## 2019-01-23 NOTE — Progress Notes (Signed)
During patient's pushing and laboring down processes this AM, Steward Drone, CNM, began Pitocin IV, started at [phone], and managed the Pitocin drip herself, while remaining at bedside. She was at bedside throughout pushing, delivery of infant, and repair of perineum.

## 2019-01-23 NOTE — Anesthesia Postprocedure Evaluation (Signed)
Anesthesia Post Note  Patient: Joanne Cordova  Procedure(s) Performed: AN AD HOC LABOR EPIDURAL     Patient location during evaluation: Mother Baby Anesthesia Type: Epidural Level of consciousness: awake Pain management: pain level controlled Vital Signs Assessment: post-procedure vital signs reviewed and stable Cardiovascular status: blood pressure returned to baseline Postop Assessment: epidural receding, no backache, no headache and patient able to bend at knees Anesthetic complications: no Comments: Phone postop. Pt reports no problems and full sensation returned.    Last Vitals:  Vitals:   01/23/19 1420 01/23/19 1600  BP: 132/78 136/71  Pulse: 80 100  Resp: 20 20  Temp: 36.8 C 37 C  SpO2: 100% 98%    Last Pain:  Vitals:   01/23/19 1600  TempSrc: Oral  PainSc: 0-No pain   Pain Goal:                   Edison Pace

## 2019-01-23 NOTE — Anesthesia Preprocedure Evaluation (Signed)
Anesthesia Evaluation  Patient identified by MRN, date of birth, ID band Patient awake    Reviewed: Allergy & Precautions, H&P , NPO status , Patient's Chart, lab work & pertinent test results  History of Anesthesia Complications Negative for: history of anesthetic complications  Airway Mallampati: II  TM Distance: >3 FB Neck ROM: full    Dental no notable dental hx.    Pulmonary neg pulmonary ROS,    Pulmonary exam normal        Cardiovascular negative cardio ROS Normal cardiovascular exam Rhythm:regular Rate:Normal     Neuro/Psych negative neurological ROS  negative psych ROS   GI/Hepatic negative GI ROS, Neg liver ROS,   Endo/Other  diabetes, Gestational  Renal/GU negative Renal ROS  negative genitourinary   Musculoskeletal   Abdominal   Peds  Hematology negative hematology ROS (+)   Anesthesia Other Findings   Reproductive/Obstetrics (+) Pregnancy                             Anesthesia Physical Anesthesia Plan  ASA: II  Anesthesia Plan: Epidural   Post-op Pain Management:    Induction:   PONV Risk Score and Plan:   Airway Management Planned:   Additional Equipment:   Intra-op Plan:   Post-operative Plan:   Informed Consent: I have reviewed the patients History and Physical, chart, labs and discussed the procedure including the risks, benefits and alternatives for the proposed anesthesia with the patient or authorized representative who has indicated his/her understanding and acceptance.       Plan Discussed with:   Anesthesia Plan Comments:         Anesthesia Quick Evaluation  

## 2019-01-23 NOTE — Progress Notes (Signed)
Patient ID: Joanne Cordova, female   DOB: 2001-06-05, 18 y.o.   MRN: [id number]  Progress update: Pt now C/100/+1, FHR 140s, +accels, occ variables, ctx q 4-5 mins spont, VSS but slightly tachycardic. Spoke with [place] and updated her on pt status; plan to labor down until pt feels urge to push, or start pushing at 0730, whichever comes first. Anticipate SVD.  Arabella Merles CNM 01/23/2019

## 2019-01-24 LAB — GLUCOSE, CAPILLARY: Glucose-Capillary: 100 mg/dL — ABNORMAL HIGH (ref 70–99)

## 2019-01-24 LAB — RPR: RPR Ser Ql: NONREACTIVE

## 2019-01-24 NOTE — Lactation Note (Signed)
This note was copied from a baby's chart. Lactation Consultation Note  Patient Name: Joanne Cordova [id number] Date: 01/24/2019 Reason for consult: Initial assessment;Primapara;Term Baby is 10 hours old.  Mom came in formula feeding but now would like to breastfeed.  Baby has been formula feeding.  Mom willing to allow me to assist.  Baby is currently sleeping swaddled in crib.  Clothes removed and waking techniques done.  Positioned baby in football hold.  Mom has flat nipples and thick areolar tissue.  Shells at the bedside but she wasn't sure how to wear them.  Instructed on wearing shells.  Instructed on hand expression and colostrum easily expressed.  Baby showing cues and opening mouth but unable to grasp tissue.  24 mm nipple shield applied.  Baby latched well and 30 minute feeding observed with swallows.  Baby was still feeding when I left.  Symphony pump set up at bedside.  Instructed mom to ask RN to show her pump when she is done feeding.  Instructed to pump every 3 hours x 15 minutes.  Instructed to feed with cues using nipple shield.  Call for assist prn.  Maternal Data Has patient been taught Hand Expression?: Yes Does the patient have breastfeeding experience prior to this delivery?: No  Feeding Feeding Type: Breast Fed  LATCH Score Latch: Grasps breast easily, tongue down, lips flanged, rhythmical sucking.(with nipple shield)  Audible Swallowing: A few with stimulation  Type of Nipple: Flat  Comfort (Breast/Nipple): Soft / non-tender  Hold (Positioning): Assistance needed to correctly position infant at breast and maintain latch.  LATCH Score: 7  Interventions Interventions: Breast feeding basics reviewed;Assisted with latch;Breast compression;Shells;Adjust position;Breast massage;Support pillows;DEBP  Lactation Tools Discussed/Used Tools: Shells;Nipple Shields Nipple shield size: 24   Consult Status Consult Status: Follow-up Date: 01/25/19 Follow-up type:  In-patient    Huston Foley 01/24/2019, 2:55 PM

## 2019-01-24 NOTE — Progress Notes (Signed)
Post Partum Day 1 Subjective: no complaints, up ad lib, voiding, tolerating PO and + flatus  Objective: Blood pressure 130/77, pulse 98, temperature 97.8 F (36.6 C), temperature source Oral, resp. rate 18, height 5\' 5"  (1.651 m), weight 83 kg, last menstrual period 04/23/2018, SpO2 100 %, unknown if currently breastfeeding.  Physical Exam:  General: alert, cooperative, appears stated age and no distress Lochia: appropriate Uterine Fundus: firm Incision: NA DVT Evaluation: No evidence of DVT seen on physical exam.  Recent Labs    01/22/19 0822  HGB 10.6*  HCT 34.1*    Assessment/Plan: Plan for discharge tomorrow, Breastfeeding, Circumcision prior to discharge and Contraception undecided. List given.    LOS: 2 days   [place] 01/24/2019, 4:39 PM

## 2019-01-25 MED ORDER — IBUPROFEN 600 MG PO TABS: 600.0000 mg | ORAL_TABLET | Freq: Four times a day (QID) | ORAL | 0 refills | Status: DC

## 2019-02-06 ENCOUNTER — Other Ambulatory Visit: Payer: Self-pay

## 2019-02-06 ENCOUNTER — Ambulatory Visit (INDEPENDENT_AMBULATORY_CARE_PROVIDER_SITE_OTHER): Payer: Medicaid Other

## 2019-02-06 DIAGNOSIS — Z9189 Other specified personal risk factors, not elsewhere classified: Secondary | ICD-10-CM

## 2019-02-06 NOTE — Progress Notes (Signed)
   TELEHEALTH VIRTUAL GYNECOLOGY VISIT ENCOUNTER NOTE  I connected with Joanne Cordova on 02/06/19 at  1:45 PM EDT by WebEx at home and verified that I am speaking with the correct person using two identifiers.   I discussed the limitations, risks, security and privacy concerns of performing an evaluation and management service by telephone and the availability of in person appointments. I also discussed with the patient that there may be a patient responsible charge related to this service. The patient expressed understanding and agreed to proceed.   History:  Joanne Cordova is a 18 y.o. G31P1011 female being evaluated today for a postpartum mood check. She delivered on 4/16 via VAVD and was discharged on PP day 2. She denies any complaints at this time. Infant resting on mother's chest during visit. She reports bottle feeding, sleeping well and having lots of support from family.     Past Medical History:  Diagnosis Date  . Diabetes mellitus without complication (HCC)   . Diet controlled gestational diabetes mellitus (GDM) in third trimester 11/21/2018   Guidelines for Antenatal Testing and Sonography  (with updated ICD-10 codes)  Updated  June 25, 2018 with Dr. Noralee Space  INDICATION U/S 2 X week NST/AFI  or full BPP wkly DELIVERY Diabetes   A1 - good control - O24.410    A2 - good control - O24.419      A2  - poor control or poor compliance - O24.419, E11.65   (Macrosomia or polyhydramnios) **E11.65 is extra code for poor control**    A2/B - O24.91  . Gestational diabetes   . Hx of chlamydia infection 2018  . Hx of gonorrhea 2018   Past Surgical History:  Procedure Laterality Date  . NO PAST SURGERIES     The following portions of the patient's history were reviewed and updated as appropriate: allergies, current medications, past family history, past medical history, past social history, past surgical history and problem list.   Review of Systems:  Pertinent items noted in HPI and  remainder of comprehensive ROS otherwise negative.  Physical Exam:   General:  Alert, oriented and cooperative.   Mental Status: Normal mood and affect perceived. Normal judgment and thought content.  Physical exam deferred due to nature of the encounter  Assessment and Plan:     1. At risk for depressed mood during postpartum period -Patient doing well and adjusting well with infant -No complaints. Scheduled for PP appointment on 5/28 and wants to discuss contraception at that time.       I discussed the assessment and treatment plan with the patient. The patient was provided an opportunity to ask questions and all were answered. The patient agreed with the plan and demonstrated an understanding of the instructions.   The patient was advised to call back or seek an in-person evaluation/go to the ED if the symptoms worsen or if the condition fails to improve as anticipated.  I provided 10 minutes of non-face-to-face time during this encounter.   Rolm Bookbinder, CNM [facility] [employer], [facility]

## 2019-02-06 NOTE — Progress Notes (Signed)
Pt is on the phone preparing for Webex visit with provider, PP 12 days.

## 2019-03-06 ENCOUNTER — Ambulatory Visit: Payer: Medicaid Other | Admitting: Certified Nurse Midwife

## 2019-03-06 ENCOUNTER — Other Ambulatory Visit: Payer: Medicaid Other

## 2019-03-11 ENCOUNTER — Other Ambulatory Visit: Payer: Medicaid Other

## 2019-03-11 ENCOUNTER — Ambulatory Visit: Payer: Medicaid Other | Admitting: Nurse Practitioner

## 2019-03-14 ENCOUNTER — Encounter: Payer: Self-pay | Admitting: Medical

## 2019-03-14 ENCOUNTER — Other Ambulatory Visit: Payer: Medicaid Other

## 2019-03-14 ENCOUNTER — Ambulatory Visit (INDEPENDENT_AMBULATORY_CARE_PROVIDER_SITE_OTHER): Payer: Medicaid Other | Admitting: Medical

## 2019-03-14 ENCOUNTER — Other Ambulatory Visit: Payer: Self-pay

## 2019-03-14 VITALS — BP 125/78 | HR 92 | Ht 66.0 in | Wt 159.8 lb

## 2019-03-14 DIAGNOSIS — Z1389 Encounter for screening for other disorder: Secondary | ICD-10-CM

## 2019-03-14 DIAGNOSIS — O24415 Gestational diabetes mellitus in pregnancy, controlled by oral hypoglycemic drugs: Secondary | ICD-10-CM

## 2019-03-14 NOTE — Progress Notes (Signed)
Post Partum Exam  Joanne Cordova is a 18 y.o. G46P1011 female who presents for a postpartum visit. She is 7 weeks postpartum following a spontaneous vaginal delivery. I have fully reviewed the prenatal and intrapartum course. The delivery was at 39 gestational weeks.  Anesthesia: epidural. Postpartum course has been uncomplicated. Baby's course has been uncomplicated. Baby is feeding by bottle - Enfamil Nutramigen. Bleeding no bleeding. Bowel function is normal. Bladder function is normal. Patient is not sexually active. Contraception method is abstinence. Postpartum depression screening:neg  The following portions of the patient's history were reviewed and updated as appropriate: allergies, current medications, past family history, past medical history, past social history, past surgical history and problem list. Last pap smear done N/A (under 51 y.o) and was N/A  Review of Systems Pertinent items are noted in HPI.    Objective:  Blood pressure 125/78, pulse 92, height 5\' 6"  (1.676 m), weight 159 lb 12.8 oz (72.5 kg), last menstrual period 03/10/2019, not currently breastfeeding.  General:  alert and cooperative   Breasts:  Deferred  Lungs: clear to auscultation bilaterally  Heart:  regular rate and rhythm, S1, S2 normal, no murmur, click, rub or gallop  Abdomen: soft, non-tender; bowel sounds normal; no masses,  no organomegaly   Vulva:  not evaluated  Vagina: not evaluated  Cervix:  not evaluated  Corpus: not examined  Adnexa:  not evaluated  Rectal Exam: Not performed.        Assessment:    Routine postpartum exam.    Plan:   1. Contraception: abstinence 2. Patient encouraged to use condoms if desired to resume sexual activity prior to implementing a birth control method 3. Follow up in: as needed.   Marny Lowenstein, PA-C 03/14/2019 8:24 AM

## 2019-03-14 NOTE — Patient Instructions (Signed)
Contraception Choices Contraception, also called birth control, means things to use or ways to try not to get pregnant. Hormonal birth control This kind of birth control uses hormones. Here are some types of hormonal birth control:  A tube that is put under skin of the arm (implant). The tube can stay in for as long as 3 years.  Shots to get every 3 months (injections).  Pills to take every day (birth control pills).  A patch to change 1 time each week for 3 weeks (birth control patch). After that, the patch is taken off for 1 week.  A ring to put in the vagina. The ring is left in for 3 weeks. Then it is taken out of the vagina for 1 week. Then a new ring is put in.  Pills to take after unprotected sex (emergency birth control pills). Barrier birth control Here are some types of barrier birth control:  A thin covering that is put on the penis before sex (female condom). The covering is thrown away after sex.  A soft, loose covering that is put in the vagina before sex (female condom). The covering is thrown away after sex.  A rubber bowl that sits over the cervix (diaphragm). The bowl must be made for you. The bowl is put into the vagina before sex. The bowl is left in for 6-8 hours after sex. It is taken out within 24 hours.  A small, soft cup that fits over the cervix (cervical cap). The cup must be made for you. The cup can be left in for 6-8 hours after sex. It is taken out within 48 hours.  A sponge that is put into the vagina before sex. It must be left in for at least 6 hours after sex. It must be taken out within 30 hours. Then it is thrown away.  A chemical that kills or stops sperm from getting into the uterus (spermicide). It may be a pill, cream, jelly, or foam to put in the vagina. The chemical should be used at least 10-15 minutes before sex. IUD (intrauterine) birth control An IUD is a small, T-shaped piece of plastic. It is put inside the uterus. There are two kinds:   Hormone IUD. This kind can stay in for 3-5 years.  Copper IUD. This kind can stay in for 10 years. Permanent birth control Here are some types of permanent birth control:  Surgery to block the fallopian tubes.  Having an insert put into each fallopian tube.  Surgery to tie off the tubes that carry sperm (vasectomy). Natural planning birth control Here are some types of natural planning birth control:  Not having sex on the days the woman could get pregnant.  Using a calendar: ? To keep track of the length of each period. ? To find out what days pregnancy can happen. ? To plan to not have sex on days when pregnancy can happen.  Watching for symptoms of ovulation and not having sex during ovulation. One way the woman can check for ovulation is to check her temperature.  Waiting to have sex until after ovulation. Summary  Contraception, also called birth control, means things to use or ways to try not to get pregnant.  Hormonal methods of birth control include implants, injections, pills, patches, vaginal rings, and emergency birth control pills.  Barrier methods of birth control can include female condoms, female condoms, diaphragms, cervical caps, sponges, and spermicides.  There are two types of IUD (intrauterine device) birth control.  An IUD can be put in a woman's uterus to prevent pregnancy for 3-5 years.  Permanent sterilization can be done through a procedure for males, females, or both.  Natural planning methods involve not having sex on the days when the woman could get pregnant. This information is not intended to replace advice given to you by your health care provider. Make sure you discuss any questions you have with your health care provider. Document Released: 07/23/2009 Document Revised: 05/02/2018 Document Reviewed: 10/05/2016 Elsevier Interactive Patient Education  2019 Elsevier Inc. Gestational Diabetes Mellitus, Diagnosis Gestational diabetes (gestational  diabetes mellitus) is a short-term (temporary) form of diabetes that can happen during pregnancy. It goes away after you give birth. It may be caused by one or both of these problems:  Your pancreas does not make enough of a hormone called insulin.  Your body does not respond in a normal way to insulin that it makes. Insulin lets sugars (glucose) go into cells in the body. This gives you energy. If you have diabetes, sugars cannot get into cells. This causes high blood sugar (hyperglycemia). If you get gestational diabetes, you are:  More likely to get it if you get pregnant again.  More likely to develop type 2 diabetes in the future. If gestational diabetes is treated, it may not hurt you or your baby. Your doctor will set treatment goals for you. In general, you should have these blood sugar levels:  After not eating for a long time (fasting): 95 mg/dL (5.3 mmol/L).  After meals (postprandial): ? One hour after a meal: at or below 140 mg/dL (7.8 mmol/L). ? Two hours after a meal: at or below 120 mg/dL (6.7 mmol/L).  A1c (hemoglobin A1c) level: 6-6.5%. Follow these instructions at home: Questions to ask your doctor   You may want to ask these questions: ? Do I need to meet with a diabetes educator? ? What equipment will I need to care for myself at home? ? What medicines do I need? When should I take them? ? How often do I need to check my blood sugar? ? What number can I call if I have questions? ? When is my next doctor's visit? General instructions  Take over-the-counter and prescription medicines only as told by your doctor.  Stay at a healthy weight during pregnancy.  Keep all follow-up visits as told by your doctor. This is important. Contact a doctor if:  Your blood sugar is at or above 240 mg/dL ([phone] mmol/L).  Your blood sugar is at or above 200 mg/dL (78.211.1 mmol/L) and you have ketones in your pee (urine).  You have been sick or have had a fever for 2 days or  more and you are not getting better.  You have any of these problems for more than 6 hours: ? You cannot eat or drink. ? You feel sick to your stomach (nauseous). ? You throw up (vomit). ? You have watery poop (diarrhea). Get help right away if:  Your blood sugar is lower than 54 mg/dL (3 mmol/L).  You get confused.  You have trouble: ? Thinking clearly. ? Breathing.  Your baby moves less than normal.  You have any of these: ? Moderate or large ketone levels in your pee. ? Blood coming from your vagina. ? Unusual fluid coming from your vagina. ? Early contractions. These may feel like tightness in your belly. Summary  Gestational diabetes is a short-term form of diabetes. It can happen while you are pregnant. It goes  away after you give birth.  If gestational diabetes is treated, it may not hurt you or your baby. Your doctor will set treatment goals for you.  Keep all follow-up visits as told by your doctor. This is important. This information is not intended to replace advice given to you by your health care provider. Make sure you discuss any questions you have with your health care provider. Document Released: 01/17/2016 Document Revised: 05/21/2017 Document Reviewed: 10/29/2015 Elsevier Interactive Patient Education  2019 [employer].

## 2019-03-15 LAB — GLUCOSE TOLERANCE, 2 HOURS
Glucose, 2 hour: 79 mg/dL (ref 65–139)
Glucose, GTT - Fasting: 79 mg/dL (ref 65–99)

## 2019-04-20 ENCOUNTER — Emergency Department (HOSPITAL_COMMUNITY)
Admission: EM | Admit: 2019-04-20 | Discharge: 2019-04-20 | Disposition: A | Discharge status: Home / Self Care | Payer: Medicaid Other | Attending: Emergency Medicine | Admitting: Emergency Medicine

## 2019-04-20 ENCOUNTER — Other Ambulatory Visit: Payer: Self-pay

## 2019-04-20 ENCOUNTER — Emergency Department (HOSPITAL_COMMUNITY): Payer: Medicaid Other

## 2019-04-20 ENCOUNTER — Encounter (HOSPITAL_COMMUNITY): Payer: Self-pay | Admitting: Emergency Medicine

## 2019-04-20 DIAGNOSIS — U071 COVID-19: Secondary | ICD-10-CM | POA: Diagnosis not present

## 2019-04-20 DIAGNOSIS — N3000 Acute cystitis without hematuria: Secondary | ICD-10-CM | POA: Diagnosis not present

## 2019-04-20 DIAGNOSIS — R0602 Shortness of breath: Secondary | ICD-10-CM | POA: Diagnosis not present

## 2019-04-20 DIAGNOSIS — Z79899 Other long term (current) drug therapy: Secondary | ICD-10-CM | POA: Insufficient documentation

## 2019-04-20 DIAGNOSIS — R51 Headache: Secondary | ICD-10-CM | POA: Diagnosis present

## 2019-04-20 LAB — URINALYSIS, ROUTINE W REFLEX MICROSCOPIC
Bilirubin Urine: NEGATIVE
Glucose, UA: NEGATIVE mg/dL
Hgb urine dipstick: NEGATIVE
Ketones, ur: NEGATIVE mg/dL
Nitrite: NEGATIVE
Protein, ur: 30 mg/dL — AB
Specific Gravity, Urine: 1.028 (ref 1.005–1.030)
pH: 7 (ref 5.0–8.0)

## 2019-04-20 LAB — CBC WITH DIFFERENTIAL/PLATELET
Abs Immature Granulocytes: 0 10*3/uL (ref 0.00–0.07)
Basophils Absolute: 0 10*3/uL (ref 0.0–0.1)
Basophils Relative: 0 %
Eosinophils Absolute: 0 10*3/uL (ref 0.0–0.5)
Eosinophils Relative: 0 %
HCT: 40.7 % (ref 36.0–46.0)
Hemoglobin: 13 g/dL (ref 12.0–15.0)
Immature Granulocytes: 0 %
Lymphocytes Relative: 50 %
Lymphs Abs: 2 10*3/uL (ref 0.7–4.0)
MCH: 27.4 pg (ref 26.0–34.0)
MCHC: 31.9 g/dL (ref 30.0–36.0)
MCV: 85.9 fL (ref 80.0–100.0)
Monocytes Absolute: 0.4 10*3/uL (ref 0.1–1.0)
Monocytes Relative: 10 %
Neutro Abs: 1.6 10*3/uL — ABNORMAL LOW (ref 1.7–7.7)
Neutrophils Relative %: 40 %
Platelets: 185 10*3/uL (ref 150–400)
RBC: 4.74 MIL/uL (ref 3.87–5.11)
RDW: 15 % (ref 11.5–15.5)
WBC: 4 10*3/uL (ref 4.0–10.5)
nRBC: 0 % (ref 0.0–0.2)

## 2019-04-20 LAB — TROPONIN I (HIGH SENSITIVITY)
Troponin I (High Sensitivity): <2 ng/L (ref <18)
Troponin I (High Sensitivity): <2 ng/L (ref <18)

## 2019-04-20 LAB — D-DIMER, QUANTITATIVE: D-Dimer, Quant: 0.65 ug/mL-FEU — ABNORMAL HIGH (ref 0.00–0.50)

## 2019-04-20 LAB — BASIC METABOLIC PANEL
Anion gap: 8 (ref 5–15)
BUN: 13 mg/dL (ref 6–20)
CO2: 22 mmol/L (ref 22–32)
Calcium: 8.8 mg/dL — ABNORMAL LOW (ref 8.9–10.3)
Chloride: 107 mmol/L (ref 98–111)
Creatinine, Ser: 0.5 mg/dL (ref 0.44–1.00)
GFR calc Af Amer: >60 mL/min (ref >60)
GFR calc non Af Amer: >60 mL/min (ref >60)
Glucose, Bld: 81 mg/dL (ref 70–99)
Potassium: 3.8 mmol/L (ref 3.5–5.1)
Sodium: 137 mmol/L (ref 135–145)

## 2019-04-20 LAB — PREGNANCY, URINE: Preg Test, Ur: NEGATIVE

## 2019-04-20 LAB — SARS CORONAVIRUS 2 BY RT PCR (HOSPITAL ORDER, PERFORMED IN ~~LOC~~ HOSPITAL LAB): SARS Coronavirus 2: POSITIVE — AB

## 2019-04-20 MED ORDER — DIPHENHYDRAMINE HCL 50 MG/ML IJ SOLN
25.0000 mg | Freq: Once | INTRAMUSCULAR | Status: AC
  Administered 2019-04-20: 25 mg via INTRAVENOUS
  Filled 2019-04-20: qty 1

## 2019-04-20 MED ORDER — DEXAMETHASONE SODIUM PHOSPHATE 10 MG/ML IJ SOLN
10.0000 mg | Freq: Once | INTRAMUSCULAR | Status: AC
  Administered 2019-04-20: 10 mg via INTRAVENOUS
  Filled 2019-04-20: qty 1

## 2019-04-20 MED ORDER — CEPHALEXIN 500 MG PO CAPS: 500.0000 mg | ORAL_CAPSULE | Freq: Two times a day (BID) | ORAL | 0 refills | Status: AC

## 2019-04-20 MED ORDER — SODIUM CHLORIDE 0.9 % IV BOLUS
1000.0000 mL | Freq: Once | INTRAVENOUS | Status: AC
  Administered 2019-04-20: 21:00:00 1000 mL via INTRAVENOUS

## 2019-04-20 MED ORDER — METOCLOPRAMIDE HCL 5 MG/ML IJ SOLN
10.0000 mg | Freq: Once | INTRAMUSCULAR | Status: AC
  Administered 2019-04-20: 10 mg via INTRAVENOUS
  Filled 2019-04-20: qty 2

## 2019-04-20 NOTE — ED Triage Notes (Signed)
Pt presents to ED via POV with c/o chest pain , HA, and loss of smell x1 day and cough, runny nose, shob x3day. Pt does not know of any positive covid exposures, works at [employer]. Has tried dayquil with vaporub without relief. Pt recently gave birth 01/2019.

## 2019-04-20 NOTE — ED Notes (Signed)
Beside toilet placed in room, aware that sample is needed.

## 2019-04-20 NOTE — ED Provider Notes (Signed)
[facility] Note   CSN: [id number] Arrival date & time: 04/20/19  1829    History   Chief Complaint Chief Complaint  Patient presents with  . Chest Pain  . Headache  . Shortness of Breath    HPI Joanne Cordova is a 18 y.o. female with past medical history of gestational diabetes presents emergency department today with chief complaint of multiple medical complaints all present x2 days.  Ultimately she came in for headache.  She states the headache is located across her forehead.  It feels like a throbbing.  She has a history of headaches and states this feels similar.  Denies sudden onset, headache has progressively worsened.  She took Tylenol without symptom relief.  She rates the pain 8 out of 10 in severity.  She admits to chest pain.  Pain is located in the center of her chest and is worse when taking a deep breath.  She describes the pain as sharp.  Pain does not radiate.  She rates the pain 6 out of 10 in severity.  She reports shortness of breath, stating when she walked upstairs this morning she was out of breath. She has had recent exposure to multiple covid positive coworkers. She admits to anosmia x 1 day.  She denies fever, chills, fever, syncope, head trauma, photophobia, phonophobia, UL throbbing pain, N/V, visual changes, stiff neck, neck pain, rash, seizure, jaw claudication, lower extremity edema.   Past Medical History:  Diagnosis Date  . Diabetes mellitus without complication (HCC)   . Diet controlled gestational diabetes mellitus (GDM) in third trimester 11/21/2018   Guidelines for Antenatal Testing and Sonography  (with updated ICD-10 codes)  Updated  06/17/2018 with Dr. Noralee Spaceavi Shankar  INDICATION U/S 2 X week NST/AFI  or full BPP wkly DELIVERY Diabetes   A1 - good control - O24.410    A2 - good control - O24.419      A2  - poor control or poor compliance - O24.419, E11.65   (Macrosomia or polyhydramnios) **E11.65 is  extra code for poor control**    A2/B - O24.91  . Gestational diabetes   . Hx of chlamydia infection 2018  . Hx of gonorrhea 2018    There are no active problems to display for this patient.   Past Surgical History:  Procedure Laterality Date  . NO PAST SURGERIES       OB History    Gravida  2   Para  1   Term  1   Preterm      AB  1   Living  1     SAB  1   TAB      Ectopic      Multiple  0   Live Births  1            Home Medications    Prior to Admission medications   Medication Sig Start Date End Date Taking? Authorizing Provider  acetaminophen (TYLENOL) 500 MG tablet Take 2 tablets (1,000 mg total) by mouth every 6 (six) hours as needed for moderate pain. 10/18/18   Gerrit HeckEmly, Jessica, CNM  cephALEXin (KEFLEX) 500 MG capsule Take 1 capsule (500 mg total) by mouth 2 (two) times daily for 7 days. 04/20/19 04/27/19  Ulonda Klosowski E, PA-C  docusate sodium (COLACE) 250 MG capsule Take 1 capsule (250 mg total) by mouth daily. Patient not taking: Reported on 01/24/2019 10/10/18   Marny LowensteinWenzel, Julie N, PA-C  ibuprofen (ADVIL)  600 MG tablet Take 1 tablet (600 mg total) by mouth every 6 (six) hours. Patient not taking: Reported on 03/14/2019 01/25/19   Gwenevere AbbotPhillip, Nimeka, MD  Prenatal MV & Min w/FA-DHA (PRENATAL ADULT GUMMY/DHA/FA) 0.4-25 MG CHEW Chew 2 tablets by mouth daily. Patient not taking: Reported on 02/06/2019 10/10/18   Marny LowensteinWenzel, Julie N, PA-C    Family History Family History  Problem Relation Age of Onset  . Cervical cancer Mother   . Diabetes Maternal Grandmother     Social History Social History   Tobacco Use  . Smoking status: Never Smoker  . Smokeless tobacco: Never Used  Substance Use Topics  . Alcohol use: Not Currently    Comment: socially  . Drug use: Not Currently    Types: Marijuana     Allergies   Patient has no known allergies.   Review of Systems Review of Systems  Constitutional: Negative for chills and fever.  HENT: Negative for  congestion, ear discharge, ear pain, sinus pressure, sinus pain and sore throat.   Eyes: Negative for pain and redness.  Respiratory: Positive for shortness of breath. Negative for cough.   Cardiovascular: Positive for chest pain.  Gastrointestinal: Negative for abdominal pain, constipation, diarrhea, nausea and vomiting.  Genitourinary: Negative for dysuria and hematuria.  Musculoskeletal: Negative for back pain and neck pain.  Skin: Negative for wound.  Neurological: Positive for headaches. Negative for weakness and numbness.     Physical Exam Updated Vital Signs BP 105/64   Pulse 69   Temp 98.2 F (36.8 C) (Oral)   Resp 20   Ht 5\' 6"  (1.676 m)   Wt 71.2 kg   LMP 04/16/2019   SpO2 98%   BMI 25.34 kg/m   Physical Exam Vitals signs and nursing note reviewed.  Constitutional:      General: She is not in acute distress.    Appearance: She is not ill-appearing.  HENT:     Head: Normocephalic and atraumatic.     Comments: No sinus or temporal tenderness.    Right Ear: Tympanic membrane and external ear normal.     Left Ear: Tympanic membrane and external ear normal.     Nose: Nose normal.     Mouth/Throat:     Mouth: Mucous membranes are moist.     Pharynx: Oropharynx is clear.  Eyes:     General: No scleral icterus.       Right eye: No discharge.        Left eye: No discharge.     Extraocular Movements: Extraocular movements intact.     Conjunctiva/sclera: Conjunctivae normal.     Pupils: Pupils are equal, round, and reactive to light.  Neck:     Musculoskeletal: Normal range of motion.     Vascular: No JVD.  Cardiovascular:     Rate and Rhythm: Normal rate and regular rhythm.     Pulses: Normal pulses.          Radial pulses are 2+ on the right side and 2+ on the left side.     Heart sounds: Normal heart sounds.  Pulmonary:     Comments: Lungs clear to auscultation in all fields. Symmetric chest rise. No wheezing, rales, or rhonchi.  Chest:     Comments: Chest  wall tenderness to palpation in center of chest. Abdominal:     Tenderness: There is no right CVA tenderness or left CVA tenderness.     Comments: Abdomen is soft, non-distended, and non-tender in all quadrants. No  rigidity, no guarding. No peritoneal signs.  Musculoskeletal: Normal range of motion.     Comments: Homans sign absent bilaterally, no lower extremity edema, no palpable cords, compartments are soft  Skin:    General: Skin is warm and dry.     Capillary Refill: Capillary refill takes less than 2 seconds.  Neurological:     Mental Status: She is oriented to person, place, and time.     GCS: GCS eye subscore is 4. GCS verbal subscore is 5. GCS motor subscore is 6.     Comments: Mental Status:  Alert, oriented, thought content appropriate, able to give a coherent history. Speech fluent without evidence of aphasia. Able to follow 2 step commands without difficulty.  Cranial Nerves:  II:  Peripheral visual fields grossly normal, pupils equal, round, reactive to light III,IV, VI: ptosis not present, extra-ocular motions intact bilaterally  V,VII: smile symmetric, facial light touch sensation equal VIII: hearing grossly normal to voice  X: uvula elevates symmetrically  XI: bilateral shoulder shrug symmetric and strong XII: midline tongue extension without fassiculations Motor:  Normal tone. 5/5 in upper and lower extremities bilaterally including strong and equal grip strength and dorsiflexion/plantar flexion Sensory: Pinprick and light touch normal in all extremities.  Deep Tendon Reflexes: 2+ and symmetric in the biceps and patella Cerebellar: normal finger-to-nose with bilateral upper extremities Gait: normal gait and balance CV: distal pulses palpable throughout   Psychiatric:        Behavior: Behavior normal.      ED Treatments / Results  Labs (all labs ordered are listed, but only abnormal results are displayed) Labs Reviewed  SARS CORONAVIRUS 2 (HOSPITAL ORDER,  [place] [facility]) - Abnormal; Notable for the following components:      Result Value   SARS Coronavirus 2 POSITIVE (*)    All other components within normal limits  BASIC METABOLIC PANEL - Abnormal; Notable for the following components:   Calcium 8.8 (*)    All other components within normal limits  CBC WITH DIFFERENTIAL/PLATELET - Abnormal; Notable for the following components:   Neutro Abs 1.6 (*)    All other components within normal limits  URINALYSIS, ROUTINE W REFLEX MICROSCOPIC - Abnormal; Notable for the following components:   APPearance HAZY (*)    Protein, ur 30 (*)    Leukocytes,Ua TRACE (*)    Bacteria, UA FEW (*)    All other components within normal limits  D-DIMER, QUANTITATIVE (NOT AT [facility]) - Abnormal; Notable for the following components:   D-Dimer, Quant 0.65 (*)    All other components within normal limits  URINE CULTURE  PREGNANCY, URINE  TROPONIN I (HIGH SENSITIVITY)  TROPONIN I (HIGH SENSITIVITY)    EKG EKG Interpretation  Date/Time:  Sunday April 20 2019 18:51:58 EDT Ventricular Rate:  81 PR Interval:    QRS Duration: 85 QT Interval:  365 QTC Calculation: 424 R Axis:   67 Text Interpretation:  Sinus rhythm Normal ECG Confirmed by Carmin Muskrat [phone]) on 04/20/2019 11:02:28 PM   Radiology Dg Chest Portable 1 View  Result Date: 04/20/2019 CLINICAL DATA:  Chest pain EXAM: PORTABLE CHEST 1 VIEW COMPARISON:  None. FINDINGS: The heart size and mediastinal contours are within normal limits. Both lungs are clear. The visualized skeletal structures are unremarkable. IMPRESSION: No active disease. Electronically Signed   By: Inez Catalina M.D.   On: 04/20/2019 20:47    Procedures Procedures (including critical care time)  Medications Ordered in ED Medications  sodium  chloride 0.9 % bolus 1,000 mL (0 mLs Intravenous Stopped 04/20/19 2248)  metoCLOPramide (REGLAN) injection 10 mg (10 mg Intravenous Given 04/20/19 2108)   diphenhydrAMINE (BENADRYL) injection 25 mg (25 mg Intravenous Given 04/20/19 2108)  dexamethasone (DECADRON) injection 10 mg (10 mg Intravenous Given 04/20/19 2108)     Initial Impression / Assessment and Plan / ED Course  I have reviewed the triage vital signs and the nursing notes.  Pertinent labs & imaging results that were available during my care of the patient were reviewed by me and considered in my medical decision making (see chart for details).  On arrival pt is overall well appearing, she has multiple medical complaints. Vital signs are normal. She has multiple positive covid exposures putting covid high on ddx. O exam neuro exam is normal without focal deficit. Chest pain is reproducible with palpation. Given pleuritic chest pain and oral contraception use cannot PERC out.   Labs significant for UA with signs of UTI so urine culture sent, D-dimer elevated to 0.65, and positive covid test. No leukocytosis. Chest xray viewed by me without infiltrate.EKG shows normal sinus rhythm. Engaged in shared decision making with pt regarding CTA chest. Suspect elevated dimer is in setting of covid. She declines imaging, I feel this is reasonable given her normal vitals and reassuring exam.   Patient is hemodynamically stable, in NAD, and able to ambulate in the ED.  Patient is comfortable with above plan and is stable for discharge at this time. Will send antibiotic to pharmacy for UTI. She is aware she needs to self quarantine x 2 weeks and continue symptomatic treatment. All questions were answered prior to disposition. Strict return precautions for returning to the ED were discussed at length. Encouraged follow up with PCP. Findings and plan of care discussed with supervising physician Dr. Jeraldine LootsLockwood.   Joanne Cordova was evaluated in Emergency Department on 04/21/2019 for the symptoms described in the history of present illness. She was evaluated in the context of the global COVID-19 pandemic,  which necessitated consideration that the patient might be at risk for infection with the SARS-CoV-2 virus that causes COVID-19. Institutional protocols and algorithms that pertain to the evaluation of patients at risk for COVID-19 are in a state of rapid change based on information released by regulatory bodies including the CDC and federal and state organizations. These policies and algorithms were followed during the patient's care in the ED.   Final Clinical Impressions(s) / ED Diagnoses   Final diagnoses:  COVID-19  Acute cystitis without hematuria    ED Discharge Orders         Ordered    cephALEXin (KEFLEX) 500 MG capsule  2 times daily     04/20/19 2244           Jermane Brayboy, [place]lyn E, PA-C 04/21/19 0252    Gerhard MunchLockwood, Robert, MD 04/21/19 1401

## 2019-04-20 NOTE — ED Notes (Signed)
PT states understanding of care given, follow up care, and medication prescribed. PT ambulated from ED to car with a steady gait. 

## 2019-04-20 NOTE — Discharge Instructions (Addendum)
You have been seen today for chest pain and shortness of breath.  Please read and follow all provided instructions. Return to the emergency room for worsening condition or new concerning symptoms.    You tested positive for coronavirus today.  You also have signs of a urinary tract infection.   1. Medications:  -Take the antibiotic Keflex for your urinary tract infection, please take as prescribed. -Continue symptomatic treatment for coronavirus as this is a virus there are no antibiotics.  You can take Tylenol if you have a headache or body aches.  You can take over-the-counter cough medicines if you have a cough.  Continue usual home medications Take medications as prescribed. Please review all of the medicines and only take them if you do not have an allergy to them.  2. Treatment: You will need to self quarantine for 2 weeks. Please read the instructions included in your paperwork about quarantine. Be sure to rest, drink plenty of fluids.  It is very important that you do your best to stay hydrated and try to eat   3. Follow Up: Please follow up with your primary doctor in 2-5 days for discussion of your diagnoses and further evaluation after today's visit; Call today to arrange your follow up.  If you do not have a primary care doctor use the resource guide provided to find one;   It is also a possibility that you have an allergic reaction to any of the medicines that you have been prescribed - Everybody reacts differently to medications and while MOST people have no trouble with most medicines, you may have a reaction such as nausea, vomiting, rash, swelling, shortness of breath. If this is the case, please stop taking the medicine immediately and contact your physician.  ?

## 2019-04-21 LAB — URINE CULTURE: Culture: <10000 — AB

## 2020-03-19 IMAGING — US US MFM OB COMP +14 WKS
1 series · 13 of 28 positions shown · non-contrast
Comparison: none

[Series 1: us mfm ob comp +14 wks · 13 of 97 slices shown]
[im 4/97]
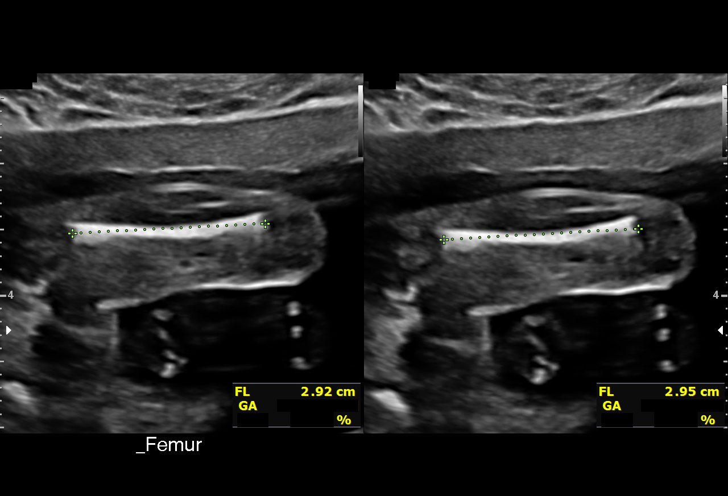
[im 11/97]
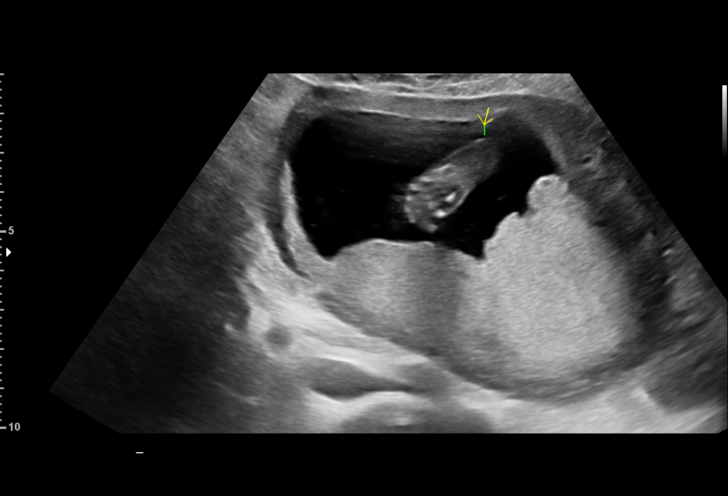
[im 18/97]
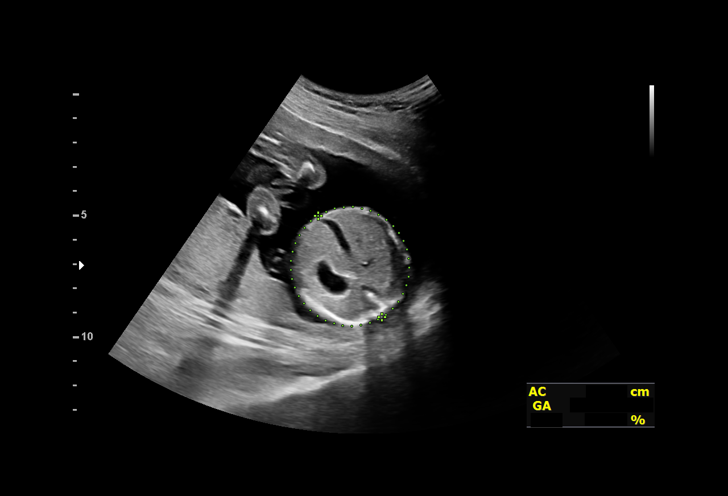
[im 25/97]
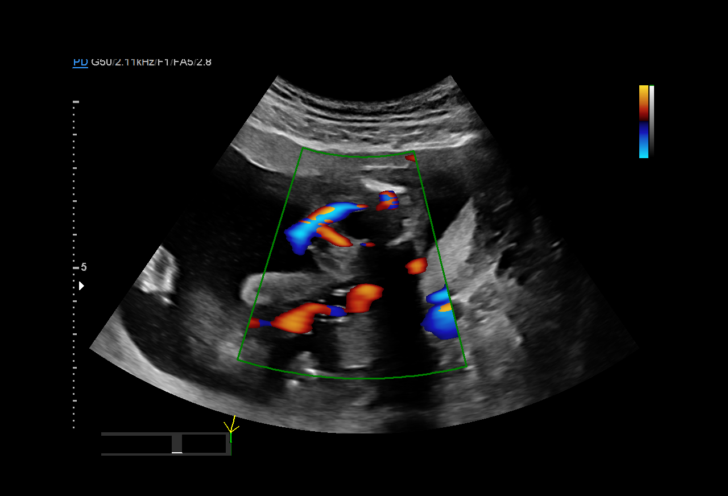
[im 33/97]
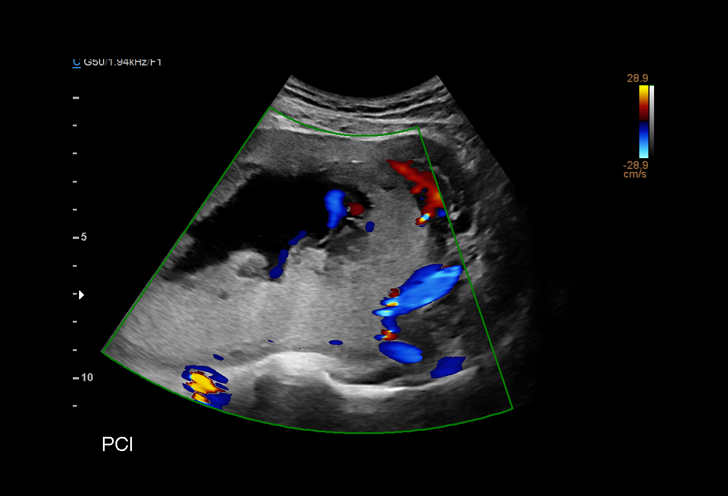
[im 40/97]
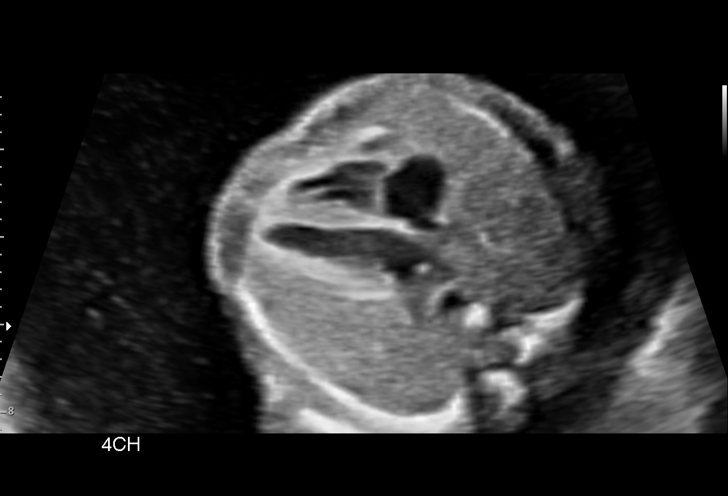
[im 50/97]
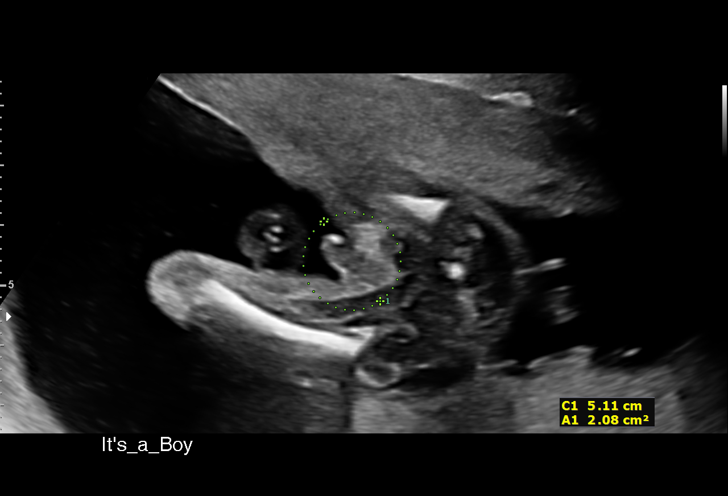
[im 57/97]
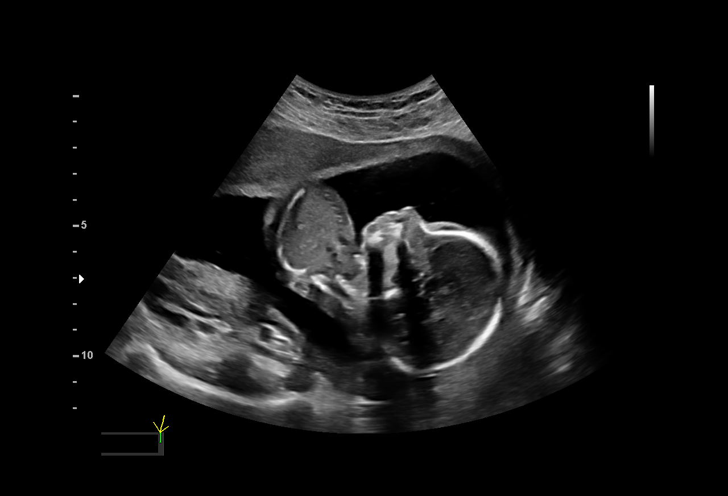
[im 65/97]
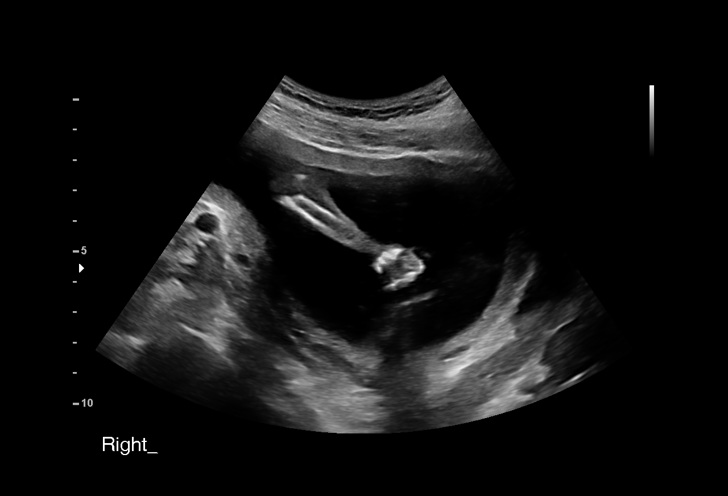
[im 72/97]
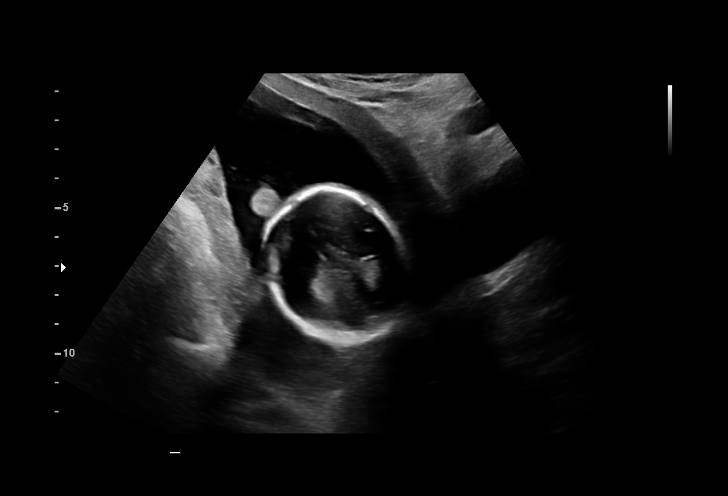
[im 79/97]
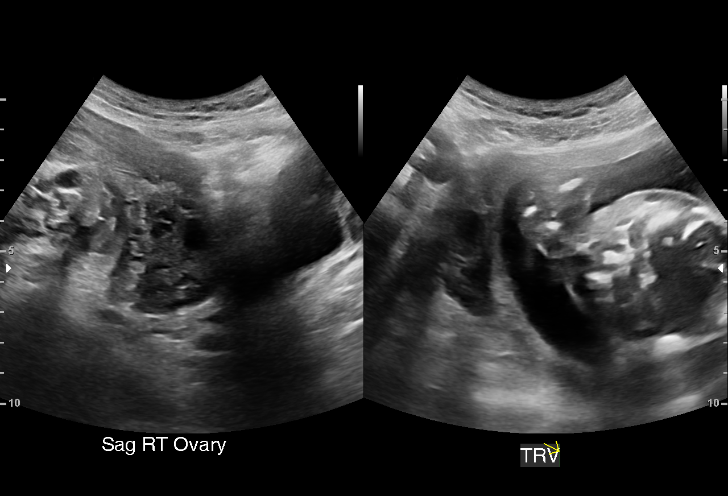
[im 86/97]
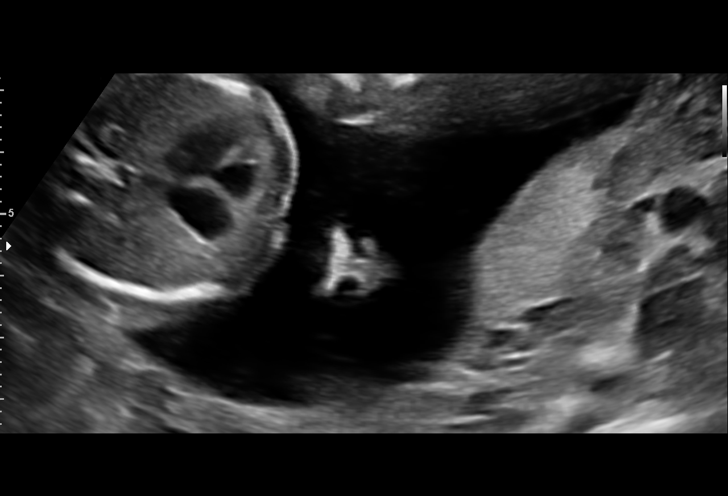
[im 93/97]
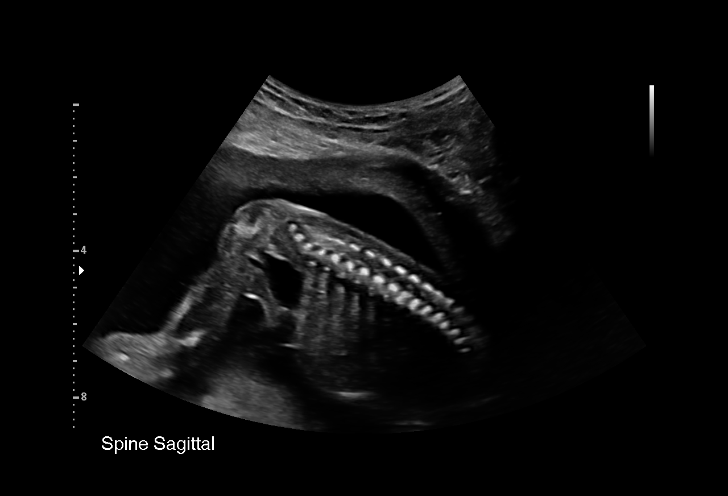

[13 of 28 positions shown; findings below may reference images not displayed]

CAYEROS
 ----------------------------------------------------------------------

 ----------------------------------------------------------------------
Indications

  Encounter for antenatal screening for
  malformations (low risk NIPS)
  19 weeks gestation of pregnancy
  Teen pregnancy
 ----------------------------------------------------------------------
Vital Signs

 BMI:
Fetal Evaluation

 Num Of Fetuses:          1
 Fetal Heart Rate(bpm):   155
 Cardiac Activity:        Observed
 Presentation:            Cephalic
 Placenta:                Posterior
 P. Cord Insertion:       Visualized

 Amniotic Fluid
 AFI FV:      Within normal limits

                             Largest Pocket(cm)

Biometry

 BPD:      46.3  mm     G. Age:  20w 0d         83  %    CI:          74.7  %    70 - 86
                                                         FL/HC:       17.4  %    16.1 -
 HC:       170   mm     G. Age:  19w 4d         65  %    HC/AC:       1.11       1.09 -
 AC:      152.6  mm     G. Age:  20w 3d         84  %    FL/BPD:      63.7  %
 FL:       29.5  mm     G. Age:  19w 1d         41  %    FL/AC:       19.3  %    20 - 24
 HUM:        31  mm     G. Age:  20w 2d         81  %
 LV:        6.4  mm
 Est. FW:     315   gm   0 lb 11 oz      56  %
OB History

 Gravidity:    2          SAB:   1
Gestational Age

 LMP:           19w 1d        Date:  04/23/18                 EDD:   01/28/19
 U/S Today:     19w 6d                                        EDD:   01/23/19
 Best:          19w 1d     Det. By:  LMP  (04/23/18)          EDD:   01/28/19
Anatomy

 Cranium:               Appears normal         LVOT:                   Appears normal
 Cavum:                 Appears normal         Aortic Arch:            Appears normal
 Ventricles:            Appears normal         Ductal Arch:            Appears normal
 Choroid Plexus:        Appears normal         Diaphragm:              Appears normal
 Cerebellum:            Not well visualized    Stomach:                Appears normal, left
                                                                       sided
 Posterior Fossa:       Not well visualized    Abdomen:                Appears normal
 Nuchal Fold:           Not well visualized    Abdominal Wall:         Appears nml (cord
                                                                       insert, abd wall)
 Face:                  Appears normal         Cord Vessels:           Appears normal (3
                        (orbits and profile)                           vessel cord)
 Lips:                  Appears normal         Kidneys:                Appear normal
 Palate:                Not well visualized    Bladder:                Appears normal
 Thoracic:              Appears normal         Spine:                  Not well visualized
 Heart:                 Appears normal         Upper Extremities:      Appears normal
                        (4CH, axis, and situs
 RVOT:                  Appears normal         Lower Extremities:      Appears normal

 Other:  Male gender. Heels and LT 5th digit visualized.  Technically difficult
         due to fetal position. Open hands visualized. Nasal bone visualized.
Cervix Uterus Adnexa

 Cervix
 Length:           3.82  cm.
 Normal appearance by transabdominal scan.

 Uterus
 No abnormality visualized.

 Left Ovary
 Size(cm)       3.4  x   1.9    x  2.2       Vol(ml):
 Within normal limits.

 Right Ovary
 Size(cm)       4.2  x   2.5    x  1.9       Vol(ml):
 Within normal limits.
Impression

 Normal interval growth.  No ultrasonic evidence of structural
 fetal anomalies.
 Suboptimal views of the fetal posterior fossa and spine.
Recommendations

 Follow up in 4 weeks to complete the fetal anatomy.

## 2020-04-17 IMAGING — US US MFM OB FOLLOW-UP
1 series · 13 of 28 positions shown · non-contrast
Comparison: none

[Series 1: us mfm ob follow-up · 13 of 68 slices shown]
[im 3/68]
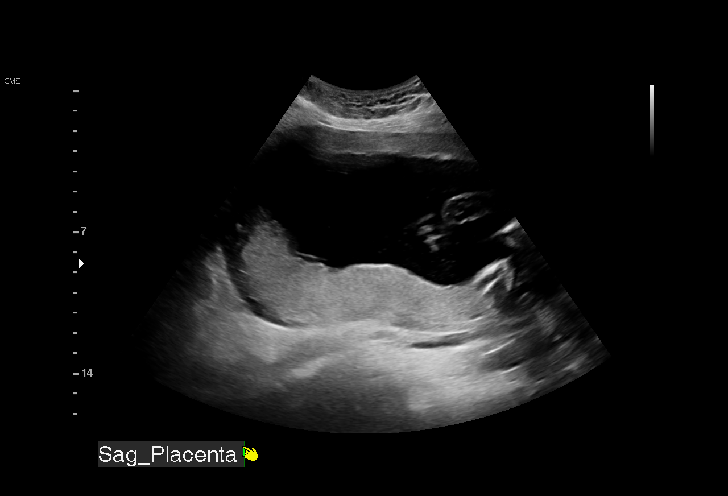
[im 8/68]
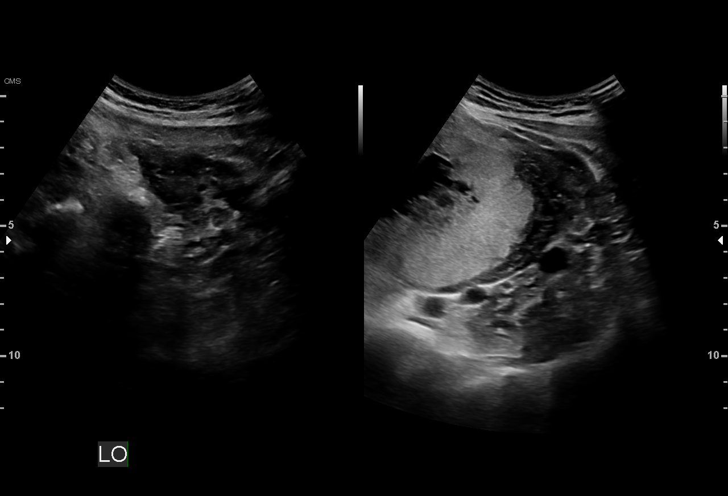
[im 13/68]
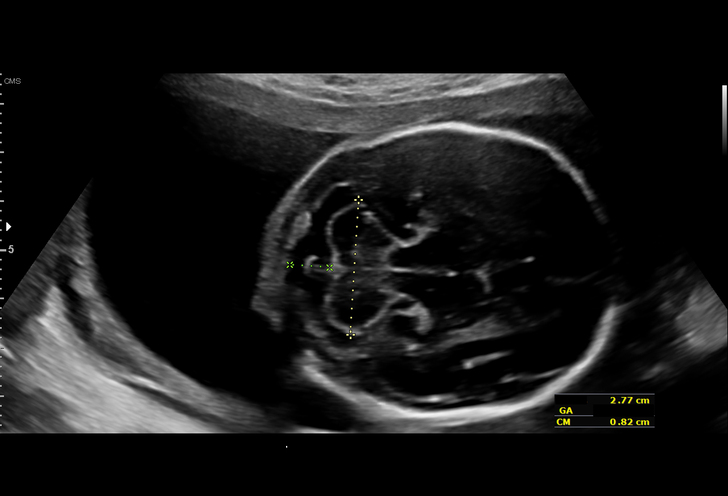
[im 18/68]
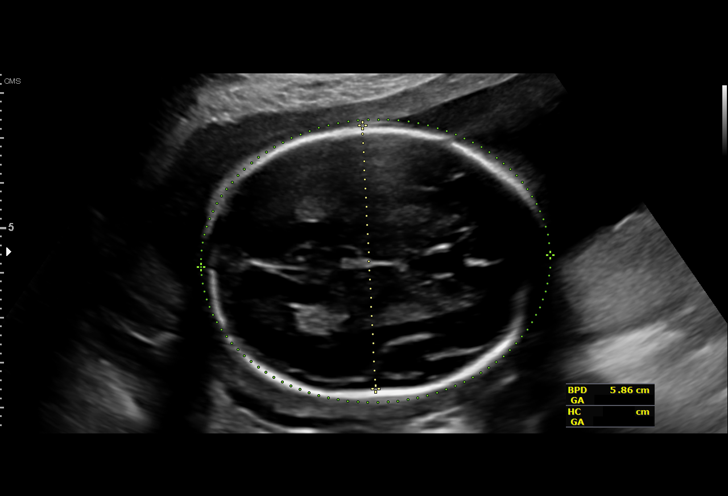
[im 23/68]
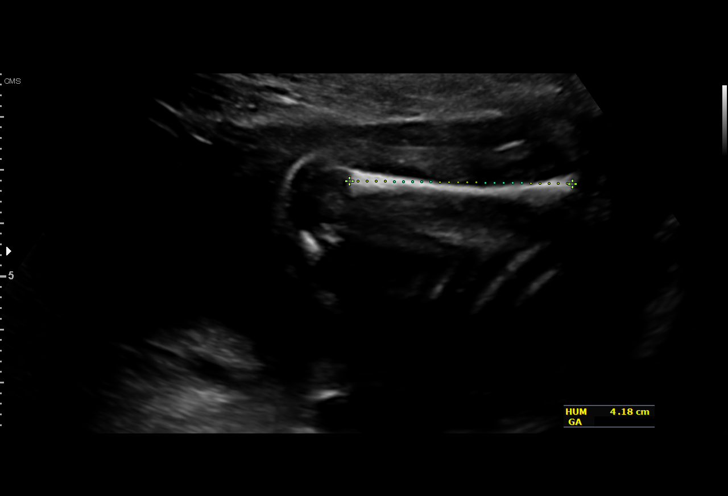
[im 28/68]
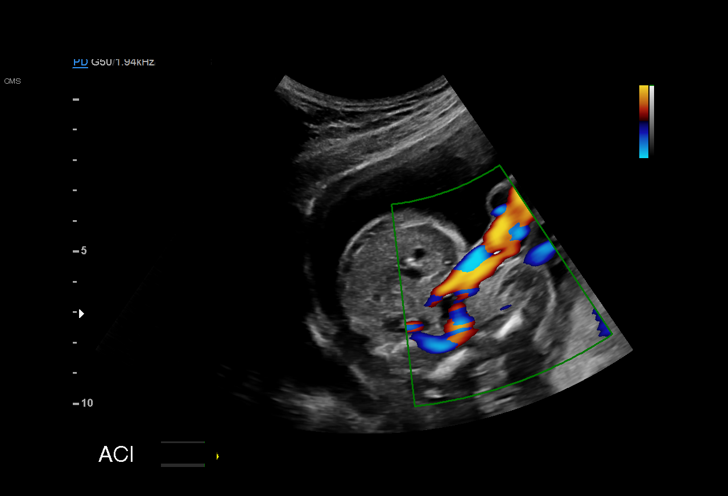
[im 35/68]
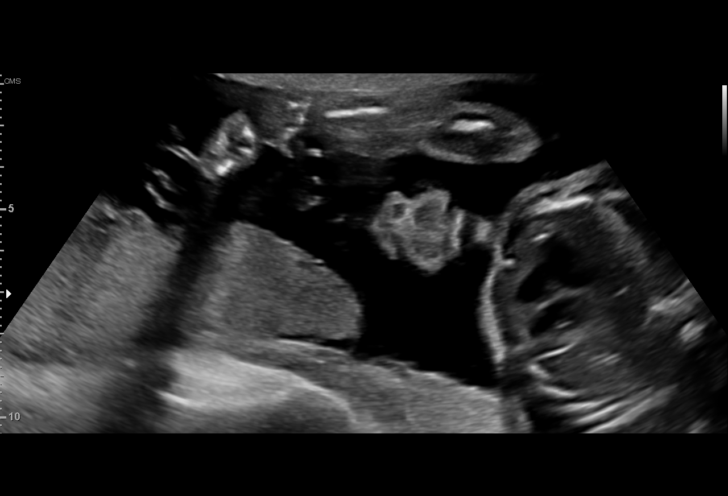
[im 40/68]
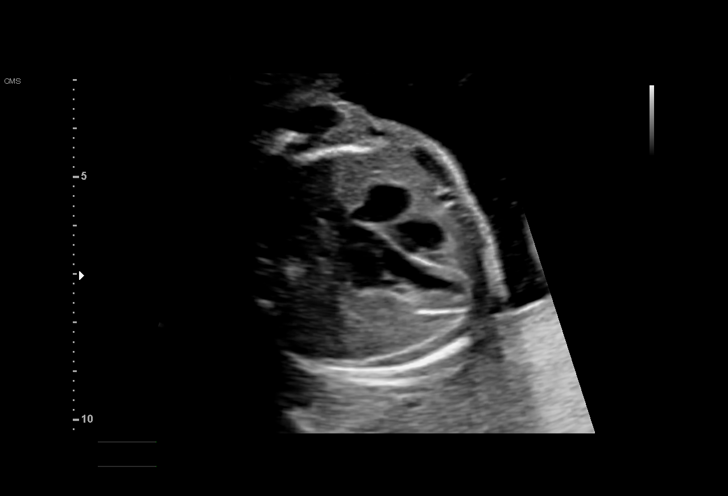
[im 45/68]
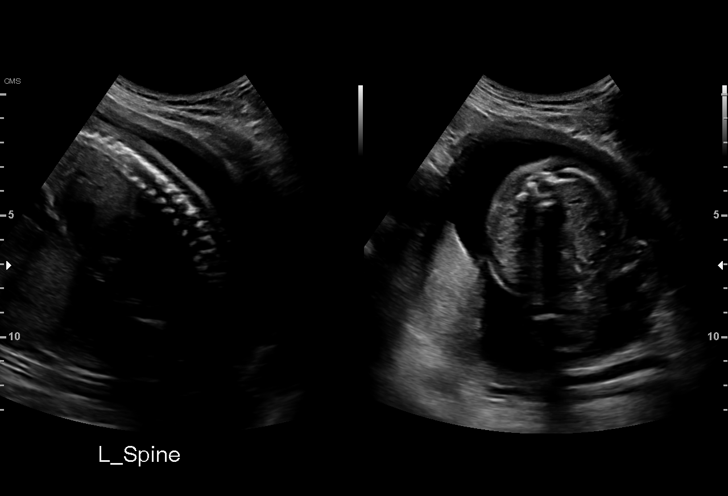
[im 50/68]
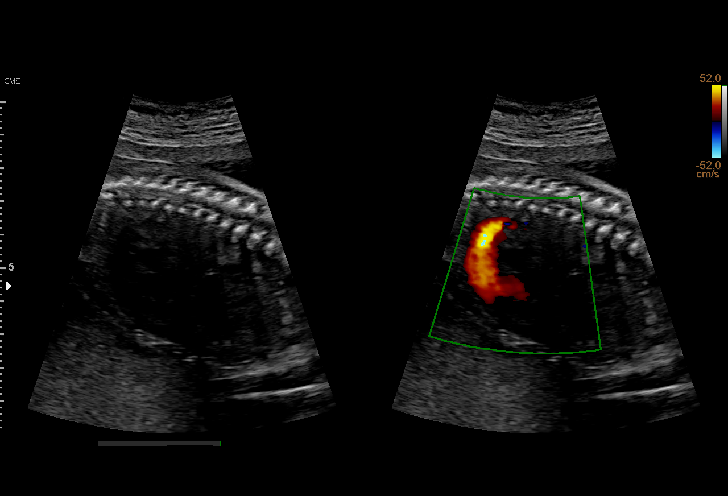
[im 55/68]
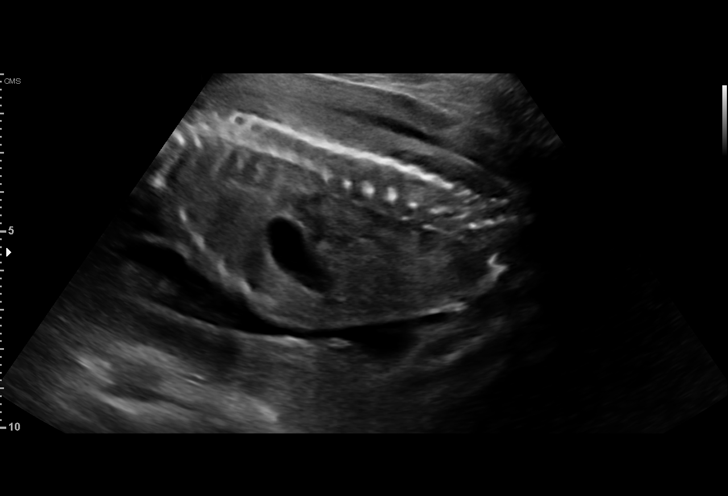
[im 60/68]
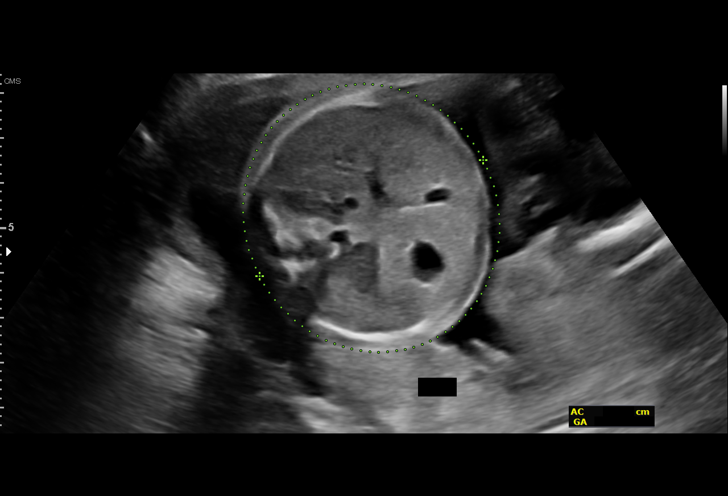
[im 65/68]
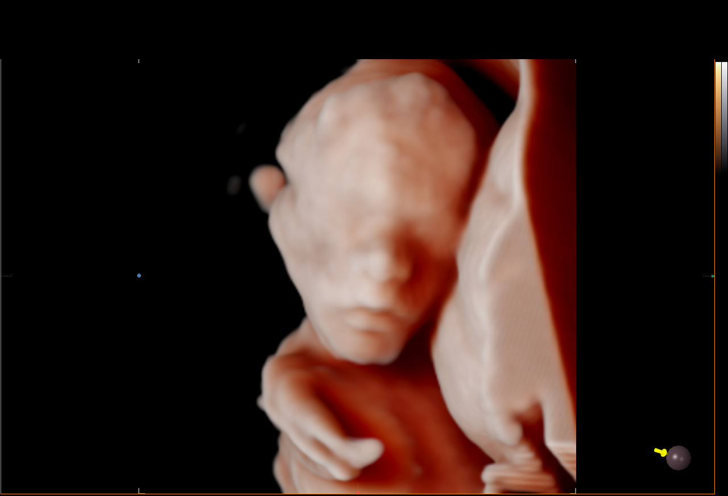

[13 of 28 positions shown; findings below may reference images not displayed]

----------------------------------------------------------------------

 ----------------------------------------------------------------------
Indications

  Antenatal follow-up for nonvisualized fetal
  anatomy
  23 weeks gestation of pregnancy
 ----------------------------------------------------------------------
Vital Signs

                                                Height:        5'6"
Fetal Evaluation

 Num Of Fetuses:         1
 Fetal Heart Rate(bpm):  149
 Cardiac Activity:       Observed
 Presentation:           Breech
 Placenta:               Posterior
 P. Cord Insertion:      Visualized

 Amniotic Fluid
 AFI FV:      Within normal limits

                             Largest Pocket(cm)

Biometry

 BPD:      58.9  mm     G. Age:  24w 0d         74  %    CI:        73.03   %    70 - 86
                                                         FL/HC:      18.2   %    19.2 -
 HC:      219.1  mm     G. Age:  23w 6d         61  %    HC/AC:      1.17        1.05 -
 AC:      187.8  mm     G. Age:  23w 4d         50  %    FL/BPD:     67.6   %    71 - 87
 FL:       39.8  mm     G. Age:  22w 6d         24  %    FL/AC:      21.2   %    20 - 24
 HUM:      41.6  mm     G. Age:  25w 1d         84  %
 CER:      27.7  mm     G. Age:  24w 6d         83  %
 CM:        8.2  mm

 Est. FW:     586  gm      1 lb 5 oz     54  %
OB History

 Gravidity:    2          SAB:   1
Gestational Age

 LMP:           23w 2d        Date:  04/23/18                 EDD:   01/28/19
 U/S Today:     23w 4d                                        EDD:   01/26/19
 Best:          23w 2d     Det. By:  LMP  (04/23/18)          EDD:   01/28/19
Anatomy

 Cranium:               Appears normal         LVOT:                   Appears normal
 Cavum:                 Appears normal         Aortic Arch:            Appears normal
 Ventricles:            Appears normal         Ductal Arch:            Appears normal
 Choroid Plexus:        Appears normal         Diaphragm:              Appears normal
 Cerebellum:            Appears normal         Stomach:                Appears normal, left
                                                                       sided
 Posterior Fossa:       Appears normal         Abdomen:                Appears normal
 Nuchal Fold:           Not applicable (>20    Abdominal Wall:         Appears nml (cord
                        wks GA)                                        insert, abd wall)
 Face:                  Appears normal         Cord Vessels:           Appears normal (3
                        (orbits and profile)                           vessel cord)
 Lips:                  Appears normal         Kidneys:                Appear normal
 Palate:                Not well visualized    Bladder:                Appears normal
 Thoracic:              Appears normal         Spine:                  Appears normal
 Heart:                 Appears normal         Upper Extremities:      Previously seen
                        (4CH, axis, and situs
 RVOT:                  Appears normal         Lower Extremities:      Previously seen

 Other:  Heels previously visualized.  5th digits visualized.  Technically difficult
         due to fetal position. Open hands visualized. Nasal bone visualized.
Cervix Uterus Adnexa

 Uterus
 No abnormality visualized.

 Left Ovary
 Within normal limits.

 Right Ovary
 Within normal limits.

 Cul De Sac
 No free fluid seen.

 Adnexa
 No abnormality visualized.
Comments

 U/S images reviewed. Appropriate fetal growth is seen.  No
 fetal abnormalities are identified.
 Recommendations: 1) Follow-up as clinically indicated
Recommendations

 Follow-up as clinically indicated
              Donkor, Jey

## 2020-05-13 ENCOUNTER — Other Ambulatory Visit: Payer: Self-pay

## 2020-05-13 DIAGNOSIS — Z20822 Contact with and (suspected) exposure to covid-19: Secondary | ICD-10-CM | POA: Diagnosis not present

## 2020-05-13 DIAGNOSIS — B9789 Other viral agents as the cause of diseases classified elsewhere: Secondary | ICD-10-CM | POA: Diagnosis not present

## 2020-05-13 DIAGNOSIS — E119 Type 2 diabetes mellitus without complications: Secondary | ICD-10-CM | POA: Diagnosis not present

## 2020-05-13 DIAGNOSIS — Z8616 Personal history of COVID-19: Secondary | ICD-10-CM | POA: Insufficient documentation

## 2020-05-13 DIAGNOSIS — J069 Acute upper respiratory infection, unspecified: Secondary | ICD-10-CM | POA: Insufficient documentation

## 2020-05-13 DIAGNOSIS — R519 Headache, unspecified: Secondary | ICD-10-CM | POA: Diagnosis present

## 2020-05-14 ENCOUNTER — Emergency Department (HOSPITAL_BASED_OUTPATIENT_CLINIC_OR_DEPARTMENT_OTHER)
Admission: EM | Admit: 2020-05-14 | Discharge: 2020-05-14 | Disposition: A | Discharge status: Home / Self Care | Payer: Medicaid Other | Attending: Emergency Medicine | Admitting: Emergency Medicine

## 2020-05-14 ENCOUNTER — Encounter (HOSPITAL_BASED_OUTPATIENT_CLINIC_OR_DEPARTMENT_OTHER): Payer: Self-pay | Admitting: Emergency Medicine

## 2020-05-14 DIAGNOSIS — B9789 Other viral agents as the cause of diseases classified elsewhere: Secondary | ICD-10-CM

## 2020-05-14 DIAGNOSIS — Z20822 Contact with and (suspected) exposure to covid-19: Secondary | ICD-10-CM

## 2020-05-14 LAB — SARS CORONAVIRUS 2 BY RT PCR (HOSPITAL ORDER, PERFORMED IN ~~LOC~~ HOSPITAL LAB): SARS Coronavirus 2: NEGATIVE

## 2020-05-14 NOTE — ED Triage Notes (Signed)
Pt states she has loss of taste and smell, headache, shortness of breath, and general fatigue  Pt states sxs started 2 days ago

## 2020-05-14 NOTE — ED Provider Notes (Signed)
[facility] Provider Note: Lowella Dell, MD, FACEP  CSN: [id number] MRN: [id number] ARRIVAL: 05/13/20 at 2338 ROOM: MH02/MH02   CHIEF COMPLAINT  covid symptoms   HISTORY OF PRESENT ILLNESS  05/14/20 4:07 AM Joanne Cordova is a 19 y.o. female who was Covid+ 04/20/2019 when she was seen in the ED for multiple complaints.  She is here now with a 2-day history of loss of taste and smell, headache, mild shortness of breath and general fatigue.  She is also having generalized aching which she rates as a 5 out of 10.  She denies sore throat or cough.  She has been taking an over-the-counter Tylenol Cold preparation with partial relief of her symptoms.  She is here mostly concerned that she may have gotten Covid again.   Past Medical History:  Diagnosis Date  . Diabetes mellitus without complication (HCC)   . Diet controlled gestational diabetes mellitus (GDM) in third trimester 11/21/2018   Guidelines for Antenatal Testing and Sonography  (with updated ICD-10 codes)  Updated  06-26-18 with Dr. Noralee Space  INDICATION U/S 2 X week NST/AFI  or full BPP wkly DELIVERY Diabetes   A1 - good control - O24.410    A2 - good control - O24.419      A2  - poor control or poor compliance - O24.419, E11.65   (Macrosomia or polyhydramnios) **E11.65 is extra code for poor control**    A2/B - O24.91  . Gestational diabetes   . Hx of chlamydia infection 2018  . Hx of gonorrhea 2018    Past Surgical History:  Procedure Laterality Date  . NO PAST SURGERIES      Family History  Problem Relation Age of Onset  . Cervical cancer Mother   . Diabetes Maternal Grandmother     Social History   Tobacco Use  . Smoking status: Never Smoker  . Smokeless tobacco: Never Used  Vaping Use  . Vaping Use: Never used  Substance Use Topics  . Alcohol use: Not Currently    Comment: socially  . Drug use: Not Currently    Types: Marijuana    Prior to Admission medications   Not on File     Allergies Patient has no known allergies.   REVIEW OF SYSTEMS  Negative except as noted here or in the History of Present Illness.   PHYSICAL EXAMINATION  Initial Vital Signs Blood pressure 122/69, pulse 84, temperature 98 F (36.7 C), temperature source Oral, resp. rate 15, height 5\' 6"  (1.676 m), weight 68 kg, last menstrual period 05/10/2020, SpO2 99 %, not currently breastfeeding.  Examination General: Well-developed, well-nourished female in no acute distress; appearance consistent with age of record HENT: normocephalic; atraumatic Eyes: pupils equal, round and reactive to light; extraocular muscles intact Neck: supple Heart: regular rate and rhythm Lungs: clear to auscultation bilaterally Abdomen: soft; nondistended; nontender; bowel sounds present Extremities: No deformity; full range of motion; pulses normal Neurologic: Awake, alert and oriented; motor function intact in all extremities and symmetric; no facial droop Skin: Warm and dry Psychiatric: Normal mood and affect   RESULTS  Summary of this visit's results, reviewed and interpreted by myself:   EKG Interpretation  Date/Time:    Ventricular Rate:    PR Interval:    QRS Duration:   QT Interval:    QTC Calculation:   R Axis:     Text Interpretation:        Laboratory Studies: Results for orders placed or performed during the  hospital encounter of 05/14/20 (from the past 24 hour(s))  SARS Coronavirus 2 by RT PCR (hospital order, performed in [facility] hospital lab) Nasopharyngeal Nasopharyngeal Swab     Status: None   Collection Time: 05/14/20 12:17 AM   Specimen: Nasopharyngeal Swab  Result Value Ref Range   SARS Coronavirus 2 NEGATIVE NEGATIVE   Imaging Studies: No results found.  ED COURSE and MDM  Nursing notes, initial and subsequent vitals signs, including pulse oximetry, reviewed and interpreted by myself.  Vitals:   05/14/20 0010 05/14/20 0015  BP:  122/69  Pulse:  84  Resp:  15   Temp:  98 F (36.7 C)  TempSrc:  Oral  SpO2:  99%  Weight: 68 kg   Height: 5\' 6"  (1.676 m)    Medications - No data to display  Presentation consistent with viral respiratory illness, Covid test negative.  She did have Covid last year so likely developed immunity.  PROCEDURES  Procedures   ED DIAGNOSES     ICD-10-CM   1. Viral respiratory infection  J98.8    B97.89   2. COVID-19 virus not detected  Z03.818        Saverio Kader, , MD 05/14/20 [phone]

## 2020-06-18 ENCOUNTER — Other Ambulatory Visit: Payer: Self-pay

## 2020-10-09 ENCOUNTER — Emergency Department (HOSPITAL_COMMUNITY)
Admission: EM | Admit: 2020-10-09 | Discharge: 2020-10-09 | Disposition: A | Discharge status: Home / Self Care | Payer: Medicaid Other | Attending: Emergency Medicine | Admitting: Emergency Medicine

## 2020-10-09 ENCOUNTER — Emergency Department (HOSPITAL_COMMUNITY): Payer: Medicaid Other

## 2020-10-09 ENCOUNTER — Encounter (HOSPITAL_COMMUNITY): Payer: Self-pay | Admitting: *Deleted

## 2020-10-09 ENCOUNTER — Other Ambulatory Visit: Payer: Self-pay

## 2020-10-09 DIAGNOSIS — O24410 Gestational diabetes mellitus in pregnancy, diet controlled: Secondary | ICD-10-CM | POA: Diagnosis not present

## 2020-10-09 DIAGNOSIS — O4691 Antepartum hemorrhage, unspecified, first trimester: Secondary | ICD-10-CM | POA: Diagnosis present

## 2020-10-09 DIAGNOSIS — Z3A08 8 weeks gestation of pregnancy: Secondary | ICD-10-CM | POA: Insufficient documentation

## 2020-10-09 DIAGNOSIS — Z3A01 Less than 8 weeks gestation of pregnancy: Secondary | ICD-10-CM

## 2020-10-09 DIAGNOSIS — R109 Unspecified abdominal pain: Secondary | ICD-10-CM

## 2020-10-09 LAB — URINALYSIS, ROUTINE W REFLEX MICROSCOPIC
Bilirubin Urine: NEGATIVE
Glucose, UA: NEGATIVE mg/dL
Hgb urine dipstick: NEGATIVE
Ketones, ur: NEGATIVE mg/dL
Nitrite: NEGATIVE
Protein, ur: NEGATIVE mg/dL
Specific Gravity, Urine: 1.015 (ref 1.005–1.030)
pH: 8 (ref 5.0–8.0)

## 2020-10-09 LAB — CBC WITH DIFFERENTIAL/PLATELET
Abs Immature Granulocytes: 0.02 10*3/uL (ref 0.00–0.07)
Basophils Absolute: 0 10*3/uL (ref 0.0–0.1)
Basophils Relative: 0 %
Eosinophils Absolute: 0 10*3/uL (ref 0.0–0.5)
Eosinophils Relative: 0 %
HCT: 40 % (ref 36.0–46.0)
Hemoglobin: 13.3 g/dL (ref 12.0–15.0)
Immature Granulocytes: 0 %
Lymphocytes Relative: 22 %
Lymphs Abs: 1.8 10*3/uL (ref 0.7–4.0)
MCH: 30.2 pg (ref 26.0–34.0)
MCHC: 33.3 g/dL (ref 30.0–36.0)
MCV: 90.7 fL (ref 80.0–100.0)
Monocytes Absolute: 0.7 10*3/uL (ref 0.1–1.0)
Monocytes Relative: 9 %
Neutro Abs: 5.3 10*3/uL (ref 1.7–7.7)
Neutrophils Relative %: 69 %
Platelets: 204 10*3/uL (ref 150–400)
RBC: 4.41 MIL/uL (ref 3.87–5.11)
RDW: 12.2 % (ref 11.5–15.5)
WBC: 7.8 10*3/uL (ref 4.0–10.5)
nRBC: 0 % (ref 0.0–0.2)

## 2020-10-09 LAB — BASIC METABOLIC PANEL
Anion gap: 6 (ref 5–15)
BUN: 13 mg/dL (ref 6–20)
CO2: 24 mmol/L (ref 22–32)
Calcium: 8.8 mg/dL — ABNORMAL LOW (ref 8.9–10.3)
Chloride: 109 mmol/L (ref 98–111)
Creatinine, Ser: 0.51 mg/dL (ref 0.44–1.00)
GFR, Estimated: >60 mL/min (ref >60)
Glucose, Bld: 94 mg/dL (ref 70–99)
Potassium: 3.9 mmol/L (ref 3.5–5.1)
Sodium: 139 mmol/L (ref 135–145)

## 2020-10-09 LAB — POC URINE PREG, ED: Preg Test, Ur: POSITIVE — AB

## 2020-10-09 LAB — HCG, QUANTITATIVE, PREGNANCY: hCG, Beta Chain, Quant, S: 3225 m[IU]/mL — ABNORMAL HIGH (ref <5)

## 2020-10-09 NOTE — ED Triage Notes (Addendum)
Pt complains of cramping and spotting since yesterday. LMP was 11/28. Pt states there is a chance she may be pregnant. She is not on birth control. She thinks she has a yeast infection.

## 2020-10-09 NOTE — ED Provider Notes (Signed)
[facility]-EMERGENCY DEPT Provider Note   CSN: [id number] Arrival date & time: 10/09/20  1243     History Chief Complaint  Patient presents with   Abdominal Cramping   Vaginal Bleeding    Joanne Cordova is a 20 y.o. female.  HPI Patient is a 20 year old female G3 T1 P0 A1 L1 who presents the emergency department due to vaginal discharge.  She states she woke up this morning with a small amount of whitish/pink discharge.  This is resolved.  She states that this occurred with her previous pregnancy.  Also reports some mild vaginal itching.  States she recently switched her laundry detergent and believes this is likely the cause.  She is sexually active with one female partner and denies using contraception.  Her last menstrual period was around November 20 and she states was normal.  Patient has no physical complaints at this time.  Denies any chest pain, shortness of breath, URI symptoms, abdominal pain, pelvic pain, dysuria, hematuria, continued vaginal discharge.     Past Medical History:  Diagnosis Date   Diabetes mellitus without complication (HCC)    Diet controlled gestational diabetes mellitus (GDM) in third trimester 11/21/2018   Guidelines for Antenatal Testing and Sonography  (with updated ICD-10 codes)  Updated  2018/06/26 with Dr. Noralee Space  INDICATION U/S 2 X week NST/AFI  or full BPP wkly DELIVERY Diabetes   A1 - good control - O24.410    A2 - good control - O24.419      A2  - poor control or poor compliance - O24.419, E11.65   (Macrosomia or polyhydramnios) **E11.65 is extra code for poor control**    A2/B - O24.91   Gestational diabetes    Hx of chlamydia infection 2018   Hx of gonorrhea 2018    There are no problems to display for this patient.   Past Surgical History:  Procedure Laterality Date   NO PAST SURGERIES       OB History    Gravida  2   Para  1   Term  1   Preterm      AB  1   Living  1     SAB  1   IAB       Ectopic      Multiple  0   Live Births  1           Family History  Problem Relation Age of Onset   Cervical cancer Mother    Diabetes Maternal Grandmother     Social History   Tobacco Use   Smoking status: Never Smoker   Smokeless tobacco: Never Used  Vaping Use   Vaping Use: Never used  Substance Use Topics   Alcohol use: Not Currently    Comment: socially   Drug use: Not Currently    Types: Marijuana    Home Medications Prior to Admission medications   Not on File    Allergies    Patient has no known allergies.  Review of Systems   Review of Systems  All other systems reviewed and are negative. Ten systems reviewed and are negative for acute change, except as noted in the HPI.    Physical Exam Updated Vital Signs BP 114/61    Pulse 79    Temp 98 F (36.7 C) (Oral)    Resp 14    LMP 09/05/2020    SpO2 100%   Physical Exam Vitals and nursing note reviewed.  Constitutional:  General: She is not in acute distress.    Appearance: Normal appearance. She is not ill-appearing, toxic-appearing or diaphoretic.  HENT:     Head: Normocephalic and atraumatic.     Right Ear: External ear normal.     Left Ear: External ear normal.     Nose: Nose normal.     Mouth/Throat:     Mouth: Mucous membranes are moist.     Pharynx: Oropharynx is clear. No oropharyngeal exudate or posterior oropharyngeal erythema.  Eyes:     General: No scleral icterus.       Right eye: No discharge.        Left eye: No discharge.     Extraocular Movements: Extraocular movements intact.     Conjunctiva/sclera: Conjunctivae normal.  Cardiovascular:     Rate and Rhythm: Normal rate and regular rhythm.     Pulses: Normal pulses.     Heart sounds: Normal heart sounds. No murmur heard. No friction rub. No gallop.   Pulmonary:     Effort: Pulmonary effort is normal. No respiratory distress.     Breath sounds: Normal breath sounds. No stridor. No wheezing, rhonchi or  rales.  Abdominal:     General: Abdomen is flat.     Palpations: Abdomen is soft.     Tenderness: There is no abdominal tenderness.     Comments: Abdomen is flat, soft, and nontender in all 4 quadrants with deep palpation.  Genitourinary:    Comments: Patient declined pelvic exam. Musculoskeletal:        General: Normal range of motion.     Cervical back: Normal range of motion and neck supple. No tenderness.  Skin:    General: Skin is warm and dry.  Neurological:     General: No focal deficit present.     Mental Status: She is alert and oriented to person, place, and time.  Psychiatric:        Mood and Affect: Mood normal.        Behavior: Behavior normal.     ED Results / Procedures / Treatments   Labs (all labs ordered are listed, but only abnormal results are displayed) Labs Reviewed  BASIC METABOLIC PANEL - Abnormal; Notable for the following components:      Result Value   Calcium 8.8 (*)    All other components within normal limits  HCG, QUANTITATIVE, PREGNANCY - Abnormal; Notable for the following components:   hCG, Beta Chain, Quant, S 3,225 (*)    All other components within normal limits  URINALYSIS, ROUTINE W REFLEX MICROSCOPIC - Abnormal; Notable for the following components:   APPearance CLOUDY (*)    Leukocytes,Ua LARGE (*)    Bacteria, UA RARE (*)    All other components within normal limits  POC URINE PREG, ED - Abnormal; Notable for the following components:   Preg Test, Ur POSITIVE (*)    All other components within normal limits  CBC WITH DIFFERENTIAL/PLATELET  POC SARS CORONAVIRUS 2 AG -  ED  GC/CHLAMYDIA PROBE AMP ([place]) NOT AT [facility]   EKG None  Radiology US OB LESS THAN 14 WEEKS WITH OB TRANSVAGINAL  Result Date: 10/09/2020 CLINICAL DATA:  Abdominal pain, vaginal spotting EXAM: OBSTETRIC <14 WK [place] AND TRANSVAGINAL OB US TECHNIQUE: Both transabdominal and transvaginal ultrasound examinations were performed for complete evaluation of the  gestation as well as the maternal uterus, adnexal regions, and pelvic cul-de-sac. Transvaginal technique was performed to assess early pregnancy. COMPARISON:  None. FINDINGS: Intrauterine gestational  sac: Possible very early gestational sac Yolk sac:  Not Visualized. Embryo:  Not Visualized. Cardiac Activity: Not Visualized. Heart Rate:   bpm MSD: 3 mm   5 w   0 d CRL:    mm    w    d                  [place] EDC: Subchorionic hemorrhage:  None visualized. Maternal uterus/adnexae: Corpus luteal cyst in the left ovary. No adnexal mass or free fluid. IMPRESSION: Possible very early intrauterine gestational sac. No yolk sac or fetal pole currently. This could be followed with repeat ultrasound in 14 days to confirm. Electronically Signed   By: Charlett Nose M.D.   On: 10/09/2020 16:23   Procedures Procedures   Medications Ordered in ED Medications - No data to display  ED Course  I have reviewed the triage vital signs and the nursing notes.  Pertinent labs & imaging results that were available during my care of the patient were reviewed by me and considered in my medical decision making (see chart for details).  Clinical Course as of 10/09/20 1950  Sat Oct 09, 2020  1829 Preg Test, Ur(!): POSITIVE [LJ]  1829 HCG, Beta Chain, Quant, S(!): 3,225 [LJ]  1831 US OB LESS THAN 14 WEEKS WITH OB TRANSVAGINAL IMPRESSION: Possible very early intrauterine gestational sac. No yolk sac or fetal pole currently. This could be followed with repeat ultrasound in 14 days to confirm. [LJ]  1915 Temp(!): 101 F (38.3 C) Pt states she has not had a fever. She states this in an error. No URI sx. [LJ]  1946 Leukocytes,Ua(!): LARGE [LJ]  1946 Bacteria, UA(!): RARE [LJ]    Clinical Course User Index [LJ] Placido Sou, PA-C   MDM Rules/Calculators/A&P                          Patient is a 20 year old female who presents the emergency department due to a small amount of white/pink vaginal discharge that occurred this  morning.  This is since resolved.  Patient states that her symptoms are consistent with a prior pregnancy.  She was notified that she is pregnant today.  Her last menstrual cycle was in late November.  It was normal.  She has been sexually active with a single female partner.  No protection.  Patient states that her discharge has resolved.  She notes some mild vaginal irritation after switching detergents but otherwise has no complaints.  No abdominal pain, chest pain, shortness of breath.  No nausea, vomiting, or diarrhea.  Patient declined a pelvic exam today.  We discussed my reasoning for performing one but she once again declined.  Ultrasound was obtained in triage with findings as noted below:  MPRESSION: Possible very early intrauterine gestational sac. No yolk sac or fetal pole currently. This could be followed with repeat ultrasound in 14 days to confirm.   This was discussed with the patient.  She states she has an OB/GYN and will follow up with them next week.  Recommended obtaining a UA as well as a GC/chlamydia.  She agreed.  While these labs were pending nursing staff notified me that she had eloped from the emergency department.  Patient was searched for any emergency department but unable to be found.  Final Clinical Impression(s) / ED Diagnoses Final diagnoses:  Less than [redacted] weeks gestation of pregnancy   Rx / DC Orders ED Discharge Orders    None  Rayna Sexton, PA-C 10/09/20 1950    Drenda Freeze, MD 10/18/20 [phone]

## 2020-10-09 NOTE — ED Notes (Signed)
INFORMED DR. Stevie Kern  POSITIVE POC PREG.

## 2020-11-02 ENCOUNTER — Other Ambulatory Visit: Payer: Self-pay | Admitting: *Deleted

## 2020-11-02 DIAGNOSIS — O3680X0 Pregnancy with inconclusive fetal viability, not applicable or unspecified: Secondary | ICD-10-CM

## 2020-11-02 NOTE — Progress Notes (Signed)
U/s ordered for follow up.

## 2020-11-09 ENCOUNTER — Telehealth: Payer: Medicaid Other | Admitting: Advanced Practice Midwife

## 2020-11-09 ENCOUNTER — Ambulatory Visit: Admission: RE | Admit: 2020-11-09 | Payer: Medicaid Other | Source: Ambulatory Visit

## 2020-11-10 ENCOUNTER — Other Ambulatory Visit: Payer: Self-pay

## 2020-11-10 ENCOUNTER — Ambulatory Visit
Admission: RE | Admit: 2020-11-10 | Discharge: 2020-11-10 | Disposition: A | Discharge status: Home / Self Care | Payer: Medicaid Other | Source: Ambulatory Visit | Attending: Obstetrics and Gynecology | Admitting: Obstetrics and Gynecology

## 2020-11-10 DIAGNOSIS — O3680X0 Pregnancy with inconclusive fetal viability, not applicable or unspecified: Secondary | ICD-10-CM

## 2021-07-08 ENCOUNTER — Other Ambulatory Visit: Payer: Self-pay

## 2021-07-08 ENCOUNTER — Encounter: Payer: Self-pay | Admitting: Emergency Medicine

## 2021-07-08 ENCOUNTER — Ambulatory Visit
Admission: EM | Admit: 2021-07-08 | Discharge: 2021-07-08 | Disposition: A | Discharge status: Home / Self Care | Payer: Medicaid Other | Attending: Emergency Medicine | Admitting: Emergency Medicine

## 2021-07-08 DIAGNOSIS — J039 Acute tonsillitis, unspecified: Secondary | ICD-10-CM

## 2021-07-08 DIAGNOSIS — J029 Acute pharyngitis, unspecified: Secondary | ICD-10-CM | POA: Diagnosis not present

## 2021-07-08 LAB — POCT RAPID STREP A (OFFICE): Rapid Strep A Screen: NEGATIVE

## 2021-07-08 MED ORDER — AMOXICILLIN 500 MG PO CAPS: 500.0000 mg | ORAL_CAPSULE | Freq: Two times a day (BID) | ORAL | 0 refills | Status: AC

## 2021-07-08 MED ORDER — IBUPROFEN 800 MG PO TABS: 800.0000 mg | ORAL_TABLET | Freq: Three times a day (TID) | ORAL | 0 refills | Status: AC

## 2021-07-08 NOTE — Discharge Instructions (Signed)
Strep test negative, strep culture pending I am treating you for tonsillitis Begin amoxicillin twice daily x10 days Tylenol and ibuprofen around-the-clock to help with throat pain, swollen lymph nodes Warm compresses to swollen lymph nodes Salt water gargles Honey and hot tea Please follow-up if not improving or worsening

## 2021-07-08 NOTE — ED Triage Notes (Signed)
Patient c/o RT sided cervical lymph node swelling and sore throat x 1 day.   Patient denies fever.   Patient endorses painful swallowing on " right side of throat".   Patent denies any other cold symptoms.   Patient hasn't taken any medications for symptoms.

## 2021-07-08 NOTE — ED Provider Notes (Signed)
[facility]    CSN: [id number] Arrival date & time: 07/08/21  1050      History   Chief Complaint Chief Complaint  Patient presents with   Lymphadenopathy   Sore Throat    HPI Joanne Cordova is a 20 y.o. female presenting today for evaluation of sore throat.  Reports 1 day of right-sided tonsillar swelling and swollen lymph nodes.  Denies associated congestion or cough.  Denies fevers chills or body aches.  HPI  Past Medical History:  Diagnosis Date   Diabetes mellitus without complication (HCC)    Diet controlled gestational diabetes mellitus (GDM) in third trimester 11/21/2018   Guidelines for Antenatal Testing and Sonography  (with updated ICD-10 codes)  Updated  June 19, 2018 with Dr. Noralee Space  INDICATION U/S 2 X week NST/AFI  or full BPP wkly DELIVERY Diabetes   A1 - good control - O24.410    A2 - good control - O24.419      A2  - poor control or poor compliance - O24.419, E11.65   (Macrosomia or polyhydramnios) **E11.65 is extra code for poor control**    A2/B - O24.91   Gestational diabetes    Hx of chlamydia infection 2018   Hx of gonorrhea 2018    There are no problems to display for this patient.   Past Surgical History:  Procedure Laterality Date   NO PAST SURGERIES      OB History     Gravida  2   Para  1   Term  1   Preterm      AB  1   Living  1      SAB  1   IAB      Ectopic      Multiple  0   Live Births  1            Home Medications    Prior to Admission medications   Medication Sig Start Date End Date Taking? Authorizing Provider  amoxicillin (AMOXIL) 500 MG capsule Take 1 capsule (500 mg total) by mouth 2 (two) times daily for 10 days. 07/08/21 07/18/21 Yes Antoin Dargis C, PA-C  ibuprofen (ADVIL) 800 MG tablet Take 1 tablet (800 mg total) by mouth 3 (three) times daily. 07/08/21  Yes Blythe Hartshorn C, PA-C  Multiple Vitamins-Minerals (HAIR SKIN & NAILS ADVANCED PO) Take by mouth.   Yes [provider]    Family History Family History  Problem Relation Age of Onset   Cervical cancer Mother    Diabetes Maternal Grandmother     Social History Social History   Tobacco Use   Smoking status: Never   Smokeless tobacco: Never  Vaping Use   Vaping Use: Never used  Substance Use Topics   Alcohol use: Not Currently    Comment: socially   Drug use: Not Currently    Types: Marijuana     Allergies   Patient has no known allergies.   Review of Systems Review of Systems  Constitutional:  Negative for activity change, appetite change, chills, fatigue and fever.  HENT:  Positive for sore throat. Negative for congestion, ear pain, rhinorrhea, sinus pressure and trouble swallowing.   Eyes:  Negative for discharge and redness.  Respiratory:  Negative for cough, chest tightness and shortness of breath.   Cardiovascular:  Negative for chest pain.  Gastrointestinal:  Negative for abdominal pain, diarrhea, nausea and vomiting.  Musculoskeletal:  Negative for myalgias.  Skin:  Negative for rash.  Neurological:  Negative for dizziness, light-headedness and headaches.    Physical Exam Triage Vital Signs ED Triage Vitals  Enc Vitals Group     BP      Pulse      Resp      Temp      Temp src      SpO2      Weight      Height      Head Circumference      Peak Flow      Pain Score      Pain Loc      Pain Edu?      Excl. in GC?    No data found.  Updated Vital Signs BP 115/71 (BP Location: Right Arm)   Pulse 95   Temp 98 F (36.7 C) (Oral)   Resp 16   LMP 07/03/2021 (Exact Date)   SpO2 97%   Visual Acuity Right Eye Distance:   Left Eye Distance:   Bilateral Distance:    Right Eye Near:   Left Eye Near:    Bilateral Near:     Physical Exam Vitals and nursing note reviewed.  Constitutional:      Appearance: She is well-developed.     Comments: No acute distress  HENT:     Head: Normocephalic and atraumatic.     Ears:     Comments: Bilateral ears  without tenderness to palpation of external auricle, tragus and mastoid, EAC's without erythema or swelling, TM's with good bony landmarks and cone of light. Non erythematous.      Nose: Nose normal.     Mouth/Throat:     Comments: Right tonsil with erythema and swelling, small exudate present, no soft palate swelling  Eyes:     Conjunctiva/sclera: Conjunctivae normal.  Neck:     Comments: Swollen and tender right tonsillar lymphadenopathy Cardiovascular:     Rate and Rhythm: Normal rate.  Pulmonary:     Effort: Pulmonary effort is normal. No respiratory distress.     Comments: Breathing comfortably at rest, CTABL, no wheezing, rales or other adventitious sounds auscultated  Abdominal:     General: There is no distension.  Musculoskeletal:        General: Normal range of motion.     Cervical back: Neck supple.  Skin:    General: Skin is warm and dry.  Neurological:     Mental Status: She is alert and oriented to person, place, and time.     UC Treatments / Results  Labs (all labs ordered are listed, but only abnormal results are displayed) Labs Reviewed  CULTURE, GROUP A STREP [facility])  POCT RAPID STREP A (OFFICE)    EKG   Radiology No results found.  Procedures Procedures (including critical care time)  Medications Ordered in UC Medications - No data to display  Initial Impression / Assessment and Plan / UC Course  I have reviewed the triage vital signs and the nursing notes.  Pertinent labs & imaging results that were available during my care of the patient were reviewed by me and considered in my medical decision making (see chart for details).     Strep negative, strep culture pending, given one-sided tonsillar swelling with significant lymphadenopathy opting to cover for tonsillitis/possible early peritonsillitis, amoxicillin twice daily x10 days, Tylenol and ibuprofen for pain and swelling  Discussed strict return precautions. Patient verbalized  understanding and is agreeable with plan.  Final Clinical Impressions(s) / UC Diagnoses   Final diagnoses:  Sore throat  Acute tonsillitis, unspecified etiology     Discharge Instructions      Strep test negative, strep culture pending I am treating you for tonsillitis Begin amoxicillin twice daily x10 days Tylenol and ibuprofen around-the-clock to help with throat pain, swollen lymph nodes Warm compresses to swollen lymph nodes Salt water gargles Honey and hot tea Please follow-up if not improving or worsening     ED Prescriptions     Medication Sig Dispense Auth. Provider   ibuprofen (ADVIL) 800 MG tablet Take 1 tablet (800 mg total) by mouth 3 (three) times daily. 21 tablet Karmin Kasprzak C, PA-C   amoxicillin (AMOXIL) 500 MG capsule Take 1 capsule (500 mg total) by mouth 2 (two) times daily for 10 days. 20 capsule Westyn Driggers, [place] C, PA-C      PDMP not reviewed this encounter.   Lew Dawes, [place] 07/08/21 1117

## 2021-07-11 LAB — CULTURE, GROUP A STREP (THRC)

## 2021-11-30 ENCOUNTER — Other Ambulatory Visit: Payer: Self-pay

## 2021-11-30 ENCOUNTER — Emergency Department
Admission: EM | Admit: 2021-11-30 | Discharge: 2021-11-30 | Disposition: A | Discharge status: Home / Self Care | Payer: Medicaid Other | Attending: Emergency Medicine | Admitting: Emergency Medicine

## 2021-11-30 DIAGNOSIS — L739 Follicular disorder, unspecified: Secondary | ICD-10-CM | POA: Diagnosis present

## 2021-11-30 LAB — URINALYSIS, COMPLETE (UACMP) WITH MICROSCOPIC
Bilirubin Urine: NEGATIVE
Glucose, UA: NEGATIVE mg/dL
Hgb urine dipstick: NEGATIVE
Ketones, ur: NEGATIVE mg/dL
Nitrite: NEGATIVE
Protein, ur: NEGATIVE mg/dL
Specific Gravity, Urine: 1.011 (ref 1.005–1.030)
pH: 8 (ref 5.0–8.0)

## 2021-11-30 LAB — CHLAMYDIA/NGC RT PCR (ARMC ONLY)
Chlamydia Tr: NOT DETECTED
N gonorrhoeae: NOT DETECTED

## 2021-11-30 LAB — WET PREP, GENITAL
Sperm: NONE SEEN
Trich, Wet Prep: NONE SEEN
WBC, Wet Prep HPF POC: >=10 — AB (ref <10)
Yeast Wet Prep HPF POC: NONE SEEN

## 2021-11-30 LAB — PREGNANCY, URINE: Preg Test, Ur: NEGATIVE

## 2021-11-30 MED ORDER — METRONIDAZOLE 500 MG PO TABS
500.0000 mg | ORAL_TABLET | Freq: Two times a day (BID) | ORAL | 0 refills | Status: AC
Start: 2021-11-30 — End: 2021-12-07

## 2021-11-30 MED ORDER — CEPHALEXIN 500 MG PO CAPS: 500.0000 mg | ORAL_CAPSULE | Freq: Four times a day (QID) | ORAL | 0 refills | Status: AC

## 2021-11-30 NOTE — ED Provider Notes (Signed)
[facility] Provider Note  Patient Contact: 6:55 PM (approximate)   History   Abscess   HPI  Joanne Cordova is a 21 y.o. female presents to the emergency department with a single region of folliculitis near her left labia.  Patient is also had recent unprotected sex and has concerns for possible gonorrhea and chlamydia.  She denies dyspareunia or pelvic pain.  No low back pain.      Physical Exam   Triage Vital Signs: ED Triage Vitals  Enc Vitals Group     BP 11/30/21 1729 119/86     Pulse Rate 11/30/21 1729 98     Resp 11/30/21 1729 16     Temp 11/30/21 1729 98.4 F (36.9 C)     Temp Source 11/30/21 1729 Oral     SpO2 11/30/21 1729 97 %     Weight 11/30/21 1729 156 lb (70.8 kg)     Height 11/30/21 1729 5\' 6"  (1.676 m)     Head Circumference --      Peak Flow --      Pain Score 11/30/21 1729 8     Pain Loc --      Pain Edu? --      Excl. in GC? --     Most recent vital signs: Vitals:   11/30/21 1729 11/30/21 1729  BP:  119/86  Pulse: 98 99  Resp: 16 18  Temp: 98.4 F (36.9 C) 98.4 F (36.9 C)  SpO2: 97% 97%     General: Alert and in no acute distress. Eyes:  PERRL. EOMI. Head: No acute traumatic findings Cardiovascular:  Good peripheral perfusion Respiratory: Normal respiratory effort without tachypnea or retractions. Lungs CTAB. Good air entry to the bases with no decreased or absent breath sounds. Gastrointestinal: Bowel sounds 4 quadrants. Soft and nontender to palpation. No guarding or rigidity. No palpable masses. No distention. No CVA tenderness. Musculoskeletal: Full range of motion to all extremities.  Neurologic:  No gross focal neurologic deficits are appreciated.  Skin: Patient has single region of folliculitis along left labia. Other:   ED Results / Procedures / Treatments   Labs (all labs ordered are listed, but only abnormal results are displayed) Labs Reviewed  WET PREP, GENITAL - Abnormal; Notable for  the following components:      Result Value   Clue Cells Wet Prep HPF POC PRESENT (*)    WBC, Wet Prep HPF POC >=10 (*)    All other components within normal limits  URINALYSIS, COMPLETE (UACMP) WITH MICROSCOPIC - Abnormal; Notable for the following components:   Color, Urine YELLOW (*)    APPearance CLOUDY (*)    Leukocytes,Ua LARGE (*)    Bacteria, UA RARE (*)    All other components within normal limits  CHLAMYDIA/NGC RT PCR ([facility] ONLY)            PREGNANCY, URINE  POC URINE PREG, ED         PROCEDURES:  Critical Care performed: No  Procedures   MEDICATIONS ORDERED IN ED: Medications - No data to display   IMPRESSION / MDM / ASSESSMENT AND PLAN / ED COURSE  I reviewed the triage vital signs and the nursing notes.                              Differential diagnosis includes, but is not limited to, BV, folliculitis, gonorrhea, chlamydia  Assessment and plan:  Folliculitis  BV 21 year old female presents to the emergency department with a 1 cm x 1 cm region of folliculitis along left labia and concern for STD exposure given recent unprotected sex.  Patient did have clue cells on wet prep concerning for BV.  We will treat with Keflex 4 times daily for the next 7 days and Flagyl twice for the next 7 days.  Gonorrhea and Chlamydia testing negative.     FINAL CLINICAL IMPRESSION(S) / ED DIAGNOSES   Final diagnoses:  Folliculitis     Rx / DC Orders   ED Discharge Orders          Ordered    cephALEXin (KEFLEX) 500 MG capsule  4 times daily        11/30/21 2056    metroNIDAZOLE (FLAGYL) 500 MG tablet  2 times daily        11/30/21 2056             Note:  This document was prepared using Dragon voice recognition software and may include unintentional dictation errors.   Pia Mau [place], Cordelia Poche 11/30/21 2207    Dionne Bucy, MD 11/30/21 [phone]

## 2021-11-30 NOTE — Discharge Instructions (Signed)
Take Keflex four times daily for the next seven days. Take Flagyl twice daily for the next week.

## 2021-11-30 NOTE — ED Triage Notes (Signed)
Pt here with an abscess in her vaginal fold. Abscess painful to touch. Pt in NAD in triage.

## 2022-07-04 ENCOUNTER — Other Ambulatory Visit: Payer: Self-pay

## 2022-07-04 ENCOUNTER — Emergency Department
Admission: EM | Admit: 2022-07-04 | Discharge: 2022-07-04 | Disposition: A | Discharge status: Home / Self Care | Payer: Medicaid Other | Attending: Emergency Medicine | Admitting: Emergency Medicine

## 2022-07-04 ENCOUNTER — Encounter: Payer: Self-pay | Admitting: Emergency Medicine

## 2022-07-04 DIAGNOSIS — N3000 Acute cystitis without hematuria: Secondary | ICD-10-CM | POA: Diagnosis not present

## 2022-07-04 DIAGNOSIS — A599 Trichomoniasis, unspecified: Secondary | ICD-10-CM | POA: Insufficient documentation

## 2022-07-04 DIAGNOSIS — B9689 Other specified bacterial agents as the cause of diseases classified elsewhere: Secondary | ICD-10-CM

## 2022-07-04 DIAGNOSIS — N898 Other specified noninflammatory disorders of vagina: Secondary | ICD-10-CM | POA: Diagnosis present

## 2022-07-04 DIAGNOSIS — N760 Acute vaginitis: Secondary | ICD-10-CM | POA: Diagnosis not present

## 2022-07-04 LAB — WET PREP, GENITAL
Sperm: NONE SEEN
WBC, Wet Prep HPF POC: <10 (ref <10)
Yeast Wet Prep HPF POC: NONE SEEN

## 2022-07-04 LAB — URINALYSIS, ROUTINE W REFLEX MICROSCOPIC
Bilirubin Urine: NEGATIVE
Glucose, UA: NEGATIVE mg/dL
Hgb urine dipstick: NEGATIVE
Ketones, ur: NEGATIVE mg/dL
Nitrite: NEGATIVE
Protein, ur: NEGATIVE mg/dL
Specific Gravity, Urine: 1.018 (ref 1.005–1.030)
WBC, UA: >50 WBC/hpf — ABNORMAL HIGH (ref 0–5)
pH: 8 (ref 5.0–8.0)

## 2022-07-04 LAB — POC URINE PREG, ED: Preg Test, Ur: NEGATIVE

## 2022-07-04 LAB — CHLAMYDIA/NGC RT PCR (ARMC ONLY)
Chlamydia Tr: NOT DETECTED
N gonorrhoeae: NOT DETECTED

## 2022-07-04 MED ORDER — CEPHALEXIN 500 MG PO CAPS: 500.0000 mg | ORAL_CAPSULE | Freq: Two times a day (BID) | ORAL | 0 refills | Status: AC

## 2022-07-04 MED ORDER — METRONIDAZOLE 500 MG PO TABS: 500.0000 mg | ORAL_TABLET | Freq: Two times a day (BID) | ORAL | 0 refills | Status: DC

## 2022-07-04 NOTE — ED Provider Notes (Signed)
[facility] Provider Note    Event Date/Time   First MD Initiated Contact with Patient 07/04/22 1113     (approximate)   History   Vaginal Discharge   HPI  Joanne Cordova is a 21 y.o. female with history of gestational diabetes, gonorrhea, and remaining history as listed in EMR presents to the emergency department for treatment and evaluation of vaginal discharge.  Symptoms started yesterday.  She denies fever, abdominal pain or dysuria.  She has had unprotected intercourse within the past couple of weeks.  She is unsure of any STD exposures.     Physical Exam   Triage Vital Signs: ED Triage Vitals  Enc Vitals Group     BP 07/04/22 1055 136/76     Pulse Rate 07/04/22 1055 (!) 101     Resp 07/04/22 1055 18     Temp 07/04/22 1055 98.1 F (36.7 C)     Temp Source 07/04/22 1055 Oral     SpO2 07/04/22 1055 100 %     Weight 07/04/22 1050 156 lb 1.4 oz (70.8 kg)     Height 07/04/22 1050 5\' 6"  (1.676 m)     Head Circumference --      Peak Flow --      Pain Score 07/04/22 1049 0     Pain Loc --      Pain Edu? --      Excl. in [place]? --     Most recent vital signs: Vitals:   07/04/22 1055 07/04/22 1249  BP: 136/76 130/70  Pulse: (!) 101 99  Resp: 18 18  Temp: 98.1 F (36.7 C)   SpO2: 100% 100%     General: Awake, no distress.  CV:  Good peripheral perfusion.  Resp:  Normal effort.  Abd:  No distention.  Other:  Friable cervix; thin malodorous discharge noted.   ED Results / Procedures / Treatments   Labs (all labs ordered are listed, but only abnormal results are displayed) Labs Reviewed  WET PREP, GENITAL - Abnormal; Notable for the following components:      Result Value   Trich, Wet Prep PRESENT (*)    Clue Cells Wet Prep HPF POC PRESENT (*)    All other components within normal limits  URINALYSIS, ROUTINE W REFLEX MICROSCOPIC - Abnormal; Notable for the following components:   Color, Urine YELLOW (*)    APPearance CLOUDY (*)     Leukocytes,Ua SMALL (*)    WBC, UA >50 (*)    Bacteria, UA FEW (*)    All other components within normal limits  CHLAMYDIA/NGC RT PCR ([facility] ONLY)            POC URINE PREG, ED     EKG     RADIOLOGY Not indicated.    PROCEDURES:  Critical Care performed: No  Procedures   MEDICATIONS ORDERED IN ED: Medications - No data to display   IMPRESSION / MDM / [place] / ED COURSE  I reviewed the triage vital signs and the nursing notes.                              Differential diagnosis includes, but is not limited to, trichomonas, bacterial vaginosis, chlamydia, gonorrhea, PID, acute cystitis, pregnancy  Patient's presentation is most consistent with acute illness / injury with system symptoms.  21 year old female presenting to the emergency department for evaluation of vaginal discharge that started yesterday  without known exposure to STI.  See HPI for further details.  Plan will be to get wet prep and GC chlamydia swabs.  Urinalysis already been collected and results are pending.  Pregnancy test is negative.  Trichomonas and clue cells present on wet prep.  GC chlamydia is pending.  She will be discharged home without coverage for either.  I have low suspicion that the chlamydia and gonorrhea testing will be positive.  She will be covered for acute cystitis as well.  She was encouraged to have her partner or partners tested/treated for trichomonas.  Information about the [place] was provided in discharge instructions.      FINAL CLINICAL IMPRESSION(S) / ED DIAGNOSES   Final diagnoses:  BV (bacterial vaginosis)  Trichimoniasis  Acute cystitis without hematuria     Rx / DC Orders   ED Discharge Orders          Ordered    metroNIDAZOLE (FLAGYL) 500 MG tablet  2 times daily        07/04/22 1237    cephALEXin (KEFLEX) 500 MG capsule  2 times daily        07/04/22 1237             Note:  This document was prepared  using Dragon voice recognition software and may include unintentional dictation errors.   Victorino Dike, FNP 07/04/22 1658    Naaman Plummer, MD 07/05/22 [phone]

## 2022-07-04 NOTE — ED Triage Notes (Signed)
Vaginal discharge started yesterday. Denies any pain or itching. Pt last had intercourse on 9/16 unknown if partner has STDs.

## 2022-07-04 NOTE — ED Triage Notes (Signed)
Vaginal discharge x 1 day. Denies dysuria.

## 2022-07-12 ENCOUNTER — Telehealth: Payer: Self-pay | Admitting: *Deleted

## 2022-07-12 NOTE — Telephone Encounter (Signed)
Received TC for Joanne Cordova at [facility] regarding this pt. Pt presented to [facility] for UPT which was faintly positive and [place] which showed no gestational sac. Pt reported LMP of 05/17/22. TC to pt to follow up. Pt had negative UPT 8 days ago in ED. Pt advised to make appt for UPT and possible bHCG. Pt verbalized understanding. Call transferred to schedulers.

## 2022-10-11 ENCOUNTER — Emergency Department (HOSPITAL_BASED_OUTPATIENT_CLINIC_OR_DEPARTMENT_OTHER)
Admission: EM | Admit: 2022-10-11 | Discharge: 2022-10-11 | Disposition: A | Discharge status: Home / Self Care | Payer: Medicaid Other | Attending: Emergency Medicine | Admitting: Emergency Medicine

## 2022-10-11 ENCOUNTER — Other Ambulatory Visit: Payer: Self-pay

## 2022-10-11 DIAGNOSIS — B974 Respiratory syncytial virus as the cause of diseases classified elsewhere: Secondary | ICD-10-CM | POA: Insufficient documentation

## 2022-10-11 DIAGNOSIS — E119 Type 2 diabetes mellitus without complications: Secondary | ICD-10-CM | POA: Insufficient documentation

## 2022-10-11 DIAGNOSIS — J069 Acute upper respiratory infection, unspecified: Secondary | ICD-10-CM | POA: Insufficient documentation

## 2022-10-11 DIAGNOSIS — Z20822 Contact with and (suspected) exposure to covid-19: Secondary | ICD-10-CM | POA: Insufficient documentation

## 2022-10-11 DIAGNOSIS — R0981 Nasal congestion: Secondary | ICD-10-CM | POA: Diagnosis present

## 2022-10-11 LAB — RESP PANEL BY RT-PCR (RSV, FLU A&B, COVID)  RVPGX2
Influenza A by PCR: NEGATIVE
Influenza B by PCR: NEGATIVE
Resp Syncytial Virus by PCR: POSITIVE — AB
SARS Coronavirus 2 by RT PCR: NEGATIVE

## 2022-10-11 MED ORDER — BENZONATATE 100 MG PO CAPS: 100.0000 mg | ORAL_CAPSULE | Freq: Three times a day (TID) | ORAL | 0 refills | Status: AC

## 2022-10-11 MED ORDER — FLUTICASONE PROPIONATE 50 MCG/ACT NA SUSP: 2.0000 | Freq: Every day | NASAL | 0 refills | Status: AC

## 2022-10-11 NOTE — ED Provider Notes (Signed)
Emergency Department Provider Note   I have reviewed the triage vital signs and the nursing notes.   HISTORY  Chief Complaint Nasal Congestion, Fever, and Sore Throat   HPI Joanne Cordova is a 22 y.o. female presents to the emergency department for evaluation of sore throat, fever, body aches, headache for the past 2 days.  She notes that her son was recently diagnosed with RSV and she developed symptoms shortly afterward.  No chest pain or shortness of breath.  No vomiting or diarrhea.  Headache is moderate, diffuse, gradual in onset.  No numbness/weakness.  No vision changes.   Past Medical History:  Diagnosis Date   Diabetes mellitus without complication (HCC)    Diet controlled gestational diabetes mellitus (GDM) in third trimester 11/21/2018   Guidelines for Antenatal Testing and Sonography  (with updated ICD-10 codes)  Updated  June 23, 2018 with Dr. Tama High  INDICATION U/S 2 X week NST/AFI  or full BPP wkly DELIVERY Diabetes   A1 - good control - O24.410    A2 - good control - O24.419      A2  - poor control or poor compliance - O24.419, E11.65   (Macrosomia or polyhydramnios) **E11.65 is extra code for poor control**    A2/B - O24.91   Gestational diabetes    Hx of chlamydia infection 2018   Hx of gonorrhea 2018    Review of Systems  Constitutional: Positive fever/chills Eyes: No visual changes. ENT: Positive sore throat. Cardiovascular: Denies chest pain. Respiratory: Denies shortness of breath. Positive cough.  Gastrointestinal: No abdominal pain.   Genitourinary: Negative for dysuria. Musculoskeletal: Negative for back pain. Skin: Negative for rash. Neurological: Positive HA.   ____________________________________________   PHYSICAL EXAM:  VITAL SIGNS: ED Triage Vitals  Enc Vitals Group     BP 10/11/22 0845 126/82     Pulse Rate 10/11/22 0845 97     Resp 10/11/22 0845 16     Temp 10/11/22 0845 97.8 F (36.6 C)     Temp Source 10/11/22 0845 Oral      SpO2 10/11/22 0845 97 %     Weight 10/11/22 0844 150 lb (68 kg)     Height 10/11/22 0844 5\' 6"  (1.676 m)   Constitutional: Alert and oriented. Well appearing and in no acute distress. Eyes: Conjunctivae are normal.  Head: Atraumatic. Nose: Positive congestion/rhinnorhea. Mouth/Throat: Mucous membranes are moist.  Oropharynx non-erythematous. Neck: No stridor.   Cardiovascular: Normal rate, regular rhythm. Good peripheral circulation. Grossly normal heart sounds.   Respiratory: Normal respiratory effort.  No retractions. Lungs CTAB. Gastrointestinal:  No distention.  Musculoskeletal: No gross deformities of extremities. Neurologic:  Normal speech and language. Skin:  Skin is warm, dry and intact. No rash noted.  ____________________________________________   LABS (all labs ordered are listed, but only abnormal results are displayed)  Labs Reviewed  RESP PANEL BY RT-PCR (RSV, FLU A&B, COVID)  RVPGX2 - Abnormal; Notable for the following components:      Result Value   Resp Syncytial Virus by PCR POSITIVE (*)    All other components within normal limits    ____________________________________________   PROCEDURES  Procedure(s) performed:   Procedures  None  ____________________________________________   INITIAL IMPRESSION / ASSESSMENT AND PLAN / ED COURSE  Pertinent labs & imaging results that were available during my care of the patient were reviewed by me and considered in my medical decision making (see chart for details).   This patient is Presenting for Evaluation of URI,  which does require a range of treatment options, and is a complaint that involves a moderate risk of morbidity and mortality.  The Differential Diagnoses include COVID, Flu, RSV, CAP, etc.   Clinical Laboratory Tests Ordered, included RSV positive.   Medical Decision Making: Summary:  Patient testing positive for RSV.  Minimal symptoms here.  No increased work of breathing or distress.  No  dehydration.  No hypoxemia.  Plan for supportive care and close PCP follow-up.  Patient's presentation is most consistent with acute illness / injury with system symptoms.   Disposition: discharge  ____________________________________________  FINAL CLINICAL IMPRESSION(S) / ED DIAGNOSES  Final diagnoses:  Viral URI with cough     NEW OUTPATIENT MEDICATIONS STARTED DURING THIS VISIT:  Discharge Medication List as of 10/11/2022  8:52 AM     START taking these medications   Details  benzonatate (TESSALON) 100 MG capsule Take 1 capsule (100 mg total) by mouth every 8 (eight) hours., Starting Wed 10/11/2022, Normal    fluticasone (FLONASE) 50 MCG/ACT nasal spray Place 2 sprays into both nostrils daily for 7 days., Starting Wed 10/11/2022, Until Wed 10/18/2022, Normal        Note:  This document was prepared using Dragon voice recognition software and may include unintentional dictation errors.  Nanda Quinton, MD, [facility] Emergency Medicine    Eman Morimoto, [employer] [employer], MD 10/17/22 [phone]

## 2022-10-11 NOTE — ED Triage Notes (Signed)
Pt c/o sore throat, fever, body aches, and headache x2 days.  Pain score 7/10.  Pt's son was recently diagnosed w/ RSV.

## 2022-12-24 ENCOUNTER — Emergency Department
Admission: EM | Admit: 2022-12-24 | Discharge: 2022-12-24 | Disposition: A | Discharge status: Home / Self Care | Payer: Medicaid Other | Attending: Emergency Medicine | Admitting: Emergency Medicine

## 2022-12-24 ENCOUNTER — Other Ambulatory Visit: Payer: Self-pay

## 2022-12-24 DIAGNOSIS — N898 Other specified noninflammatory disorders of vagina: Secondary | ICD-10-CM | POA: Diagnosis present

## 2022-12-24 DIAGNOSIS — A599 Trichomoniasis, unspecified: Secondary | ICD-10-CM | POA: Diagnosis not present

## 2022-12-24 LAB — URINALYSIS, COMPLETE (UACMP) WITH MICROSCOPIC
Bilirubin Urine: NEGATIVE
Glucose, UA: NEGATIVE mg/dL
Hgb urine dipstick: NEGATIVE
Ketones, ur: NEGATIVE mg/dL
Nitrite: NEGATIVE
Protein, ur: NEGATIVE mg/dL
Specific Gravity, Urine: 1.02 (ref 1.005–1.030)
pH: 8 (ref 5.0–8.0)

## 2022-12-24 LAB — WET PREP, GENITAL
Clue Cells Wet Prep HPF POC: NONE SEEN
Sperm: NONE SEEN
WBC, Wet Prep HPF POC: <10 (ref <10)
Yeast Wet Prep HPF POC: NONE SEEN

## 2022-12-24 LAB — CHLAMYDIA/NGC RT PCR (ARMC ONLY)
Chlamydia Tr: NOT DETECTED
N gonorrhoeae: NOT DETECTED

## 2022-12-24 LAB — POC URINE PREG, ED: Preg Test, Ur: NEGATIVE

## 2022-12-24 MED ORDER — METRONIDAZOLE 500 MG PO TABS: 2000.0000 mg | ORAL_TABLET | Freq: Once | ORAL | 0 refills | Status: AC

## 2022-12-24 NOTE — ED Triage Notes (Signed)
Pt reports has a new sex partner and now has some white discharge and thinks she has a yeast infection. Pt reports it does not itch, hurt or burn and does not have an odor. Pt admits that she did not use protection.

## 2022-12-24 NOTE — ED Provider Notes (Signed)
[facility] Provider Note    Event Date/Time   First MD Initiated Contact with Patient 12/24/22 1110     (approximate)   History   Vaginal Discharge   HPI  Joanne Cordova is a 22 y.o. female with no significant past medical history presents emergency department with vaginal discharge.  Patient has a new sexual partner.  They do not use protection.  Is concerned she has STD.  Denies pelvic pain.  No fever or chills      Physical Exam   Triage Vital Signs: ED Triage Vitals  Enc Vitals Group     BP 12/24/22 1013 130/75     Pulse Rate 12/24/22 1013 79     Resp 12/24/22 1013 20     Temp 12/24/22 1013 98.3 F (36.8 C)     Temp Source 12/24/22 1013 Oral     SpO2 12/24/22 1013 95 %     Weight 12/24/22 1012 149 lb 14.6 oz (68 kg)     Height 12/24/22 1012 5\' 6"  (1.676 m)     Head Circumference --      Peak Flow --      Pain Score 12/24/22 1011 0     Pain Loc --      Pain Edu? --      Excl. in [place]? --     Most recent vital signs: Vitals:   12/24/22 1013  BP: 130/75  Pulse: 79  Resp: 20  Temp: 98.3 F (36.8 C)  SpO2: 95%     General: Awake, no distress.   CV:  Good peripheral perfusion. regular rate and  rhythm Resp:  Normal effort.  Abd:  No distention.  Other:      ED Results / Procedures / Treatments   Labs (all labs ordered are listed, but only abnormal results are displayed) Labs Reviewed  WET PREP, GENITAL - Abnormal; Notable for the following components:      Result Value   Trich, Wet Prep PRESENT (*)    All other components within normal limits  URINALYSIS, COMPLETE (UACMP) WITH MICROSCOPIC - Abnormal; Notable for the following components:   Color, Urine YELLOW (*)    APPearance CLOUDY (*)    Leukocytes,Ua MODERATE (*)    Bacteria, UA RARE (*)    All other components within normal limits  CHLAMYDIA/NGC RT PCR ([facility] ONLY)            POC URINE PREG, ED      EKG     RADIOLOGY     PROCEDURES:   Procedures   MEDICATIONS ORDERED IN ED: Medications - No data to display   IMPRESSION / MDM / [place] / ED COURSE  I reviewed the triage vital signs and the nursing notes.                              Differential diagnosis includes, but is not limited to, STI, UTI, infection  Patient's presentation is most consistent with acute complicated illness / injury requiring diagnostic workup.   Patient's wet prep shows trichomoniasis Remainder of her labs are reassuring  I did explain findings to the patient.  She was given a prescription for Flagyl 2 g 1 time p.o.  She is to notify her partner.  He must also be treated prior to returning to any sexual contact.  Explained her the did need to wait in between treatments prior to returning  to sex.  She is in agreement treatment plan.  She is to follow-up with [facility] if needed.  Return if worsening.      FINAL CLINICAL IMPRESSION(S) / ED DIAGNOSES   Final diagnoses:  Trichomoniasis     Rx / DC Orders   ED Discharge Orders          Ordered    metroNIDAZOLE (FLAGYL) 500 MG tablet   Once        12/24/22 1249             Note:  This document was prepared using Dragon voice recognition software and may include unintentional dictation errors.    Versie Starks, PA-C 12/24/22 1604    Blake Divine, MD 12/25/22 [phone]

## 2022-12-24 NOTE — Discharge Instructions (Signed)
Follow-up with your regular doctor if not improving 3 to 4 days.  Return emergency department if worsening. Take your medications as prescribed

## 2023-04-11 ENCOUNTER — Other Ambulatory Visit: Payer: Self-pay

## 2023-04-11 ENCOUNTER — Encounter (HOSPITAL_BASED_OUTPATIENT_CLINIC_OR_DEPARTMENT_OTHER): Payer: Self-pay

## 2023-04-11 ENCOUNTER — Emergency Department (HOSPITAL_BASED_OUTPATIENT_CLINIC_OR_DEPARTMENT_OTHER)
Admission: EM | Admit: 2023-04-11 | Discharge: 2023-04-11 | Disposition: A | Discharge status: Home / Self Care | Payer: Medicaid Other | Attending: Emergency Medicine | Admitting: Emergency Medicine

## 2023-04-11 DIAGNOSIS — Z20822 Contact with and (suspected) exposure to covid-19: Secondary | ICD-10-CM | POA: Insufficient documentation

## 2023-04-11 DIAGNOSIS — J069 Acute upper respiratory infection, unspecified: Secondary | ICD-10-CM | POA: Insufficient documentation

## 2023-04-11 DIAGNOSIS — M791 Myalgia, unspecified site: Secondary | ICD-10-CM | POA: Diagnosis present

## 2023-04-11 LAB — RESP PANEL BY RT-PCR (RSV, FLU A&B, COVID)  RVPGX2
Influenza A by PCR: NEGATIVE
Influenza B by PCR: NEGATIVE
Resp Syncytial Virus by PCR: NEGATIVE
SARS Coronavirus 2 by RT PCR: NEGATIVE

## 2023-04-11 LAB — GROUP A STREP BY PCR: Group A Strep by PCR: NOT DETECTED

## 2023-04-11 MED ORDER — DEXAMETHASONE 4 MG PO TABS
10.0000 mg | ORAL_TABLET | Freq: Once | ORAL | Status: AC
  Administered 2023-04-11: 10 mg via ORAL
  Filled 2023-04-11: qty 3

## 2023-04-11 NOTE — ED Provider Notes (Signed)
[facility] Provider Note   CSN: [id number] Arrival date & time: 04/11/23  0830     History  Chief Complaint  Patient presents with   Generalized Body Aches    Joanne Cordova is a 22 y.o. female.  Patient here with bodyaches, runny nose, sore throat for the last day.  Took NyQuil with some help last night.  Denies any chest pain or shortness of breath or cough or sputum production or pain with urination.  Nothing makes it worse or better.  No significant medical history.  The history is provided by the patient.       Home Medications Prior to Admission medications   Medication Sig Start Date End Date Taking? Authorizing Provider  benzonatate (TESSALON) 100 MG capsule Take 1 capsule (100 mg total) by mouth every 8 (eight) hours. 10/11/22   Long, Arlyss Repress, MD  fluticasone (FLONASE) 50 MCG/ACT nasal spray Place 2 sprays into both nostrils daily for 7 days. 10/11/22 10/18/22  Long, Arlyss Repress, MD  ibuprofen (ADVIL) 800 MG tablet Take 1 tablet (800 mg total) by mouth 3 (three) times daily. Patient not taking: Reported on 11/30/2021 07/08/21   Wieters, [employer], PA-C  Multiple Vitamins-Minerals (HAIR SKIN & NAILS ADVANCED PO) Take by mouth.    [provider]      Allergies    Patient has no known allergies.    Review of Systems   Review of Systems  Physical Exam Updated Vital Signs BP 128/83 (BP Location: Right Arm)   Pulse 84   Temp 98 F (36.7 C) (Oral)   Resp 18   Wt 67.6 kg   SpO2 98%   BMI 24.05 kg/m  Physical Exam Vitals and nursing note reviewed.  Constitutional:      General: She is not in acute distress.    Appearance: She is well-developed. She is not ill-appearing.  HENT:     Head: Normocephalic and atraumatic.     Nose: Congestion present.     Mouth/Throat:     Mouth: Mucous membranes are moist.  Eyes:     Extraocular Movements: Extraocular movements intact.     Conjunctiva/sclera: Conjunctivae  normal.     Pupils: Pupils are equal, round, and reactive to light.  Cardiovascular:     Rate and Rhythm: Normal rate and regular rhythm.     Heart sounds: No murmur heard. Pulmonary:     Effort: Pulmonary effort is normal. No respiratory distress.     Breath sounds: Normal breath sounds.  Abdominal:     Palpations: Abdomen is soft.     Tenderness: There is no abdominal tenderness.  Musculoskeletal:        General: No swelling.     Cervical back: Normal range of motion and neck supple.  Skin:    General: Skin is warm and dry.     Capillary Refill: Capillary refill takes less than 2 seconds.  Neurological:     General: No focal deficit present.     Mental Status: She is alert.  Psychiatric:        Mood and Affect: Mood normal.     ED Results / Procedures / Treatments   Labs (all labs ordered are listed, but only abnormal results are displayed) Labs Reviewed  GROUP A STREP BY PCR  RESP PANEL BY RT-PCR (RSV, FLU A&B, COVID)  RVPGX2    EKG None  Radiology No results found.  Procedures Procedures    Medications  Ordered in ED Medications  dexamethasone (DECADRON) tablet 10 mg (has no administration in time range)    ED Course/ Medical Decision Making/ A&P                             Medical Decision Making Risk Prescription drug management.   SHANDORA LARSEN is here with upper respiratory symptoms.  Normal vitals.  No fever.  Will test for COVID flu and strep.  Treated with Decadron.  Recommend Tylenol and ibuprofen.  Will send an antibiotic if strep test is positive.  She is very well-appearing.  Symptoms for 1 day.  Overall suspect viral process.  Understands return precautions.  This chart was dictated using voice recognition software.  Despite best efforts to proofread,  errors can occur which can change the documentation meaning.         Final Clinical Impression(s) / ED Diagnoses Final diagnoses:  Upper respiratory tract infection, unspecified  type    Rx / DC Orders ED Discharge Orders     None         Virgina Norfolk, DO 04/11/23 [phone]

## 2023-04-11 NOTE — ED Triage Notes (Signed)
Pt states she is aching all over, had a fever last night and took Nyquil. Having stuffy nose and sneezing.

## 2023-04-11 NOTE — Discharge Instructions (Signed)
Recommend 1000 mg of Tylenol every 6 hours as needed for fever and bodyaches.  Recommend 400 mg ibuprofen every 8 hours as needed for fever and bodyaches.  Follow-up your strep test on your MyChart.  If this test is positive I will send in an antibiotic to your pharmacy.  Otherwise I think that you have a viral process.  This should self resolve.  You have been treated with a long-acting steroid to help with your symptoms as well.

## 2024-04-25 ENCOUNTER — Encounter (HOSPITAL_BASED_OUTPATIENT_CLINIC_OR_DEPARTMENT_OTHER): Payer: Self-pay | Admitting: Emergency Medicine

## 2024-04-25 ENCOUNTER — Emergency Department (HOSPITAL_BASED_OUTPATIENT_CLINIC_OR_DEPARTMENT_OTHER)
Admission: EM | Admit: 2024-04-25 | Discharge: 2024-04-25 | Disposition: A | Discharge status: Home / Self Care | Payer: Self-pay | Attending: Emergency Medicine | Admitting: Emergency Medicine

## 2024-04-25 ENCOUNTER — Other Ambulatory Visit: Payer: Self-pay

## 2024-04-25 DIAGNOSIS — N760 Acute vaginitis: Secondary | ICD-10-CM | POA: Insufficient documentation

## 2024-04-25 DIAGNOSIS — B9689 Other specified bacterial agents as the cause of diseases classified elsewhere: Secondary | ICD-10-CM | POA: Insufficient documentation

## 2024-04-25 LAB — WET PREP, GENITAL
Sperm: NONE SEEN
Trich, Wet Prep: NONE SEEN
WBC, Wet Prep HPF POC: <10 (ref <10)
Yeast Wet Prep HPF POC: NONE SEEN

## 2024-04-25 LAB — CBC
HCT: 41 % (ref 36.0–46.0)
Hemoglobin: 14.1 g/dL (ref 12.0–15.0)
MCH: 30 pg (ref 26.0–34.0)
MCHC: 34.4 g/dL (ref 30.0–36.0)
MCV: 87.2 fL (ref 80.0–100.0)
Platelets: 199 K/uL (ref 150–400)
RBC: 4.7 MIL/uL (ref 3.87–5.11)
RDW: 11.9 % (ref 11.5–15.5)
WBC: 5.7 K/uL (ref 4.0–10.5)
nRBC: 0 % (ref 0.0–0.2)

## 2024-04-25 LAB — HIV ANTIBODY (ROUTINE TESTING W REFLEX): HIV Screen 4th Generation wRfx: NONREACTIVE

## 2024-04-25 LAB — RPR: RPR Ser Ql: NONREACTIVE

## 2024-04-25 LAB — HCG, SERUM, QUALITATIVE: Preg, Serum: NEGATIVE

## 2024-04-25 MED ORDER — METRONIDAZOLE 500 MG PO TABS: 500.0000 mg | ORAL_TABLET | Freq: Two times a day (BID) | ORAL | 0 refills | Status: DC

## 2024-04-25 MED ORDER — METRONIDAZOLE 500 MG PO TABS: 500.0000 mg | ORAL_TABLET | Freq: Two times a day (BID) | ORAL | 0 refills | Status: AC

## 2024-04-25 NOTE — Discharge Instructions (Signed)
 The workup is reassuring today.  You are not pregnant but do have bacterial vaginosis.

## 2024-04-25 NOTE — ED Provider Notes (Signed)
 [facility] Provider Note   CSN: [id number] Arrival date & time: 04/25/24  9166     Patient presents with: Vaginal Bleeding   Joanne Cordova is a 23 y.o. female.    Vaginal Bleeding Patient presents with vaginal spotting.  Last 4 days.  Last menses was around a month ago.  States this is not like a normal menses.  Has had unprotected sex on the 28th.  Has had negative pregnancy tests x 2 at home however..     Prior to Admission medications   Medication Sig Start Date End Date Taking? Authorizing Provider  benzonatate  (TESSALON ) 100 MG capsule Take 1 capsule (100 mg total) by mouth every 8 (eight) hours. 10/11/22   Long, Fonda MATSU, MD  fluticasone  (FLONASE ) 50 MCG/ACT nasal spray Place 2 sprays into both nostrils daily for 7 days. 10/11/22 10/18/22  Long, Fonda MATSU, MD  ibuprofen  (ADVIL ) 800 MG tablet Take 1 tablet (800 mg total) by mouth 3 (three) times daily. Patient not taking: Reported on 11/30/2021 07/08/21   Wieters, Hallie C, PA-C  metroNIDAZOLE  (FLAGYL ) 500 MG tablet Take 1 tablet (500 mg total) by mouth 2 (two) times daily. 04/25/24   Patsey Lot, MD  Multiple Vitamins-Minerals (HAIR SKIN & NAILS ADVANCED PO) Take by mouth.    [provider]    Allergies: Patient has no known allergies.    Review of Systems  Genitourinary:  Positive for vaginal bleeding.    Updated Vital Signs BP 138/71   Pulse 77   Temp 98.1 F (36.7 C) (Oral)   Resp 16   LMP 03/25/2024   SpO2 100%   Physical Exam Vitals and nursing note reviewed.  Cardiovascular:     Rate and Rhythm: Regular rhythm.  Abdominal:     Tenderness: There is no abdominal tenderness.  Genitourinary:    Comments: Pelvic exam did show some bloody discharge.  No adnexal tenderness.  No cervical motion tenderness.  Also potentially more cloudy discharge. Skin:    Coloration: Skin is not pale.  Neurological:     Mental Status: She is alert.     (all labs  ordered are listed, but only abnormal results are displayed) Labs Reviewed  WET PREP, GENITAL - Abnormal; Notable for the following components:      Result Value   Clue Cells Wet Prep HPF POC PRESENT (*)    All other components within normal limits  CBC  HCG, SERUM, QUALITATIVE  RPR  HIV ANTIBODY (ROUTINE TESTING W REFLEX)  GC/CHLAMYDIA PROBE AMP ([place]) NOT AT [facility]    EKG: None  Radiology: No results found.   Procedures   Medications Ordered in the ED - No data to display                                  Medical Decision Making Amount and/or Complexity of Data Reviewed Labs: ordered.  Risk Prescription drug management.   Patient with some vaginal spotting.  Some blood some dark some more red.  No other discharge.  Has had unprotected sex.  Negative home pregnancy test.  Will do pelvic exam and basic blood work.  Differential diagnosis includes abnormal menses, but also causes such as ectopic pregnancy.  Pregnancy test was negative.  Wet prep did show some bacterial vaginosis.  Will treat this.  Appears stable for discharge home.     Final diagnoses:  Bacterial  vaginosis    ED Discharge Orders          Ordered    metroNIDAZOLE  (FLAGYL ) 500 MG tablet  2 times daily,   Status:  Discontinued        04/25/24 1033    metroNIDAZOLE  (FLAGYL ) 500 MG tablet  2 times daily        04/25/24 1104               Patsey Lot, MD 04/25/24 1425

## 2024-04-25 NOTE — ED Triage Notes (Signed)
 States has been spotting for past 4 days. LMP 03/25/24, denies abd pain or dysuria, no BC used

## 2024-04-25 NOTE — ED Notes (Signed)
 ED Provider at bedside.

## 2024-04-28 LAB — GC/CHLAMYDIA PROBE AMP (~~LOC~~) NOT AT ARMC
Chlamydia: NEGATIVE
Comment: NEGATIVE
Comment: NORMAL
Neisseria Gonorrhea: NEGATIVE

## 2024-10-15 ENCOUNTER — Encounter: Payer: Self-pay | Admitting: Emergency Medicine

## 2024-10-15 ENCOUNTER — Other Ambulatory Visit: Payer: Self-pay

## 2024-10-15 ENCOUNTER — Emergency Department
Admission: EM | Admit: 2024-10-15 | Discharge: 2024-10-15 | Disposition: A | Discharge status: Home / Self Care | Payer: Self-pay | Attending: Emergency Medicine | Admitting: Emergency Medicine

## 2024-10-15 DIAGNOSIS — N939 Abnormal uterine and vaginal bleeding, unspecified: Secondary | ICD-10-CM | POA: Insufficient documentation

## 2024-10-15 DIAGNOSIS — Z202 Contact with and (suspected) exposure to infections with a predominantly sexual mode of transmission: Secondary | ICD-10-CM | POA: Insufficient documentation

## 2024-10-15 LAB — WET PREP, GENITAL
Clue Cells Wet Prep HPF POC: NONE SEEN
Sperm: NONE SEEN
Trich, Wet Prep: NONE SEEN
WBC, Wet Prep HPF POC: >=10 — AB
Yeast Wet Prep HPF POC: NONE SEEN

## 2024-10-15 LAB — CHLAMYDIA/NGC RT PCR (ARMC ONLY)
Chlamydia Tr: NOT DETECTED
N gonorrhoeae: NOT DETECTED

## 2024-10-15 NOTE — ED Provider Notes (Signed)
 "  [facility] Provider Note    Event Date/Time   First MD Initiated Contact with Patient 10/15/24 2036     (approximate)   History   Vaginal Bleeding    HPI  Joanne Cordova is a 24 y.o. female    with a past medical history of BV, trichomoniasis, folliculitis abdomen pain, who presents to the ED complaining of vaginal bleeding. According to the patient, she is having vaginal bleeding when she is having sex.  Patient denies vaginal discharge, abdominal pain, dyspareunia, fever, chills, urinary symptoms.  Patient endorses that she had a new partner and she is concerned about STDs.  She desires to be checked for STDs     There are no active problems to display for this patient.    Physical Exam   Triage Vital Signs: ED Triage Vitals  Encounter Vitals Group     BP 10/15/24 1746 137/81     Girls Systolic BP Percentile --      Girls Diastolic BP Percentile --      Boys Systolic BP Percentile --      Boys Diastolic BP Percentile --      Pulse Rate 10/15/24 1746 86     Resp 10/15/24 1746 18     Temp 10/15/24 1746 98.4 F (36.9 C)     Temp Source 10/15/24 1746 Oral     SpO2 10/15/24 1746 100 %     Weight 10/15/24 1746 169 lb (76.7 kg)     Height 10/15/24 1746 5' 7 (1.702 m)     Head Circumference --      Peak Flow --      Pain Score 10/15/24 1753 0     Pain Loc --      Pain Education --      Exclude from Growth Chart --     Most recent vital signs: Vitals:   10/15/24 1746 10/15/24 2214  BP: 137/81 137/66  Pulse: 86 71  Resp: 18 18  Temp: 98.4 F (36.9 C)   SpO2: 100% 98%     Physical Exam Vitals and nursing note reviewed.  During triage vital signs were normal.  General:          Awake, no distress.  CV:                  Good peripheral perfusion. Regular rate and rhythm. Resp:               Normal effort. no tachypnea.Equal breath sounds bilaterally.  Abd:                 No distention.  Soft nontender Other:               ED  Results / Procedures / Treatments   Labs (all labs ordered are listed, but only abnormal results are displayed) Labs Reviewed  CHLAMYDIA/NGC RT PCR ([facility] ONLY)            WET PREP, GENITAL  HIV ANTIBODY (ROUTINE TESTING W REFLEX)      PROCEDURES:  Critical Care performed:   Procedures   MEDICATIONS ORDERED IN ED: Medications - No data to display    IMPRESSION / MDM / ASSESSMENT AND PLAN / ED COURSE  I reviewed the triage vital signs and the nursing notes.  Differential diagnosis includes, but is not limited to, chlamydia, gonorrhea, vaginal candidiasis  Patient's presentation is most consistent with acute complicated illness / injury requiring diagnostic workup.  Joanne Cordova is a 24 y.o., female who presents today with history of vaginal bleeding with intercourse.  See HPI for the information.  On a physical exam vital signs were normal.  Cardiopulmonary is clear, abdomen is soft not tender not distended. Plan STD test HIV Referral to OB GYN Patient's diagnosis is consistent with STD exposure. I did not order any imaging, physical exam is reassuring labs are pending patient is going to check on MyChart.  I did review the patient's allergies and medications.The patient is in stable and satisfactory condition for discharge home  Patient will be discharged home without prescriptions. Patient is to follow up with OB/GYN as needed or otherwise directed. Patient is given ED precautions to return to the ED for any worsening or new symptoms.  Offered work note but he was declined. Discussed plan of care with patient, answered all of patient's questions, patient agreeable to plan of care. Patient verbalized understanding.  FINAL CLINICAL IMPRESSION(S) / ED DIAGNOSES   Final diagnoses:  Exposure to STD  Vaginal bleeding     Rx / DC Orders   ED Discharge Orders     None        Note:  This document was prepared using Dragon voice recognition software and may  include unintentional dictation errors.   Janit Kast, PA-C 10/15/24 2219    Viviann Pastor, MD 10/15/24 2259  "

## 2024-10-15 NOTE — ED Triage Notes (Signed)
 Pt reports vaginal bleeding after having intercourse. Pt denies pain. Pt also c/o rash to her back.

## 2024-10-15 NOTE — Discharge Instructions (Signed)
 You have been diagnosed with possible exposure to STD, vaginal bleeding.  Please check your MyChart results of your STD test.  Please make an appointment with OB/GYN for a follow-up.  Please come back to ED or go to your PCP if you have new symptoms symptoms worsen.

## 2024-10-16 LAB — HIV ANTIBODY (ROUTINE TESTING W REFLEX): HIV Screen 4th Generation wRfx: NONREACTIVE
